# Patient Record
Sex: Female | Born: 1960 | Race: Black or African American | Hispanic: No | Marital: Married | Smoking: Never smoker
Health system: Southern US, Community
[De-identification: ages and names within clinical notes are randomized; demographics above are authoritative.]

## PROBLEM LIST (undated history)

## (undated) DIAGNOSIS — R7989 Other specified abnormal findings of blood chemistry: Secondary | ICD-10-CM

## (undated) DIAGNOSIS — J45909 Unspecified asthma, uncomplicated: Secondary | ICD-10-CM

## (undated) DIAGNOSIS — M4800 Spinal stenosis, site unspecified: Secondary | ICD-10-CM

## (undated) DIAGNOSIS — I10 Essential (primary) hypertension: Secondary | ICD-10-CM

## (undated) HISTORY — PX: TUBAL LIGATION: SHX77

## (undated) HISTORY — DX: Unspecified asthma, uncomplicated: J45.909

## (undated) HISTORY — PX: OOPHORECTOMY: SHX86

## (undated) HISTORY — DX: Other specified abnormal findings of blood chemistry: R79.89

## (undated) HISTORY — PX: DILATION AND CURETTAGE OF UTERUS: SHX78

## (undated) HISTORY — DX: Essential (primary) hypertension: I10

## (undated) HISTORY — DX: Spinal stenosis, site unspecified: M48.00

---

## 2002-10-03 ENCOUNTER — Other Ambulatory Visit: Admission: RE | Admit: 2002-10-03 | Discharge: 2002-10-03 | Payer: Self-pay | Admitting: Obstetrics and Gynecology

## 2008-12-17 ENCOUNTER — Encounter: Admission: RE | Admit: 2008-12-17 | Discharge: 2008-12-17 | Payer: Self-pay | Admitting: Family Medicine

## 2008-12-24 ENCOUNTER — Encounter: Admission: RE | Admit: 2008-12-24 | Discharge: 2008-12-24 | Payer: Self-pay | Admitting: Family Medicine

## 2008-12-31 ENCOUNTER — Encounter (INDEPENDENT_AMBULATORY_CARE_PROVIDER_SITE_OTHER): Payer: Self-pay | Admitting: *Deleted

## 2008-12-31 ENCOUNTER — Telehealth (INDEPENDENT_AMBULATORY_CARE_PROVIDER_SITE_OTHER): Payer: Self-pay | Admitting: *Deleted

## 2008-12-31 DIAGNOSIS — R933 Abnormal findings on diagnostic imaging of other parts of digestive tract: Secondary | ICD-10-CM | POA: Insufficient documentation

## 2009-01-01 ENCOUNTER — Telehealth (INDEPENDENT_AMBULATORY_CARE_PROVIDER_SITE_OTHER): Payer: Self-pay | Admitting: *Deleted

## 2009-01-05 ENCOUNTER — Ambulatory Visit (HOSPITAL_COMMUNITY): Admission: RE | Admit: 2009-01-05 | Discharge: 2009-01-05 | Payer: Self-pay | Admitting: Gastroenterology

## 2009-01-05 ENCOUNTER — Ambulatory Visit: Payer: Self-pay | Admitting: Gastroenterology

## 2009-05-06 ENCOUNTER — Emergency Department (HOSPITAL_COMMUNITY): Admission: EM | Admit: 2009-05-06 | Discharge: 2009-05-07 | Payer: Self-pay | Admitting: Emergency Medicine

## 2010-01-06 ENCOUNTER — Emergency Department (HOSPITAL_COMMUNITY): Admission: EM | Admit: 2010-01-06 | Discharge: 2010-01-06 | Payer: Self-pay | Admitting: Emergency Medicine

## 2010-01-30 ENCOUNTER — Encounter: Admission: RE | Admit: 2010-01-30 | Discharge: 2010-01-30 | Payer: Self-pay | Admitting: Family Medicine

## 2010-04-30 ENCOUNTER — Other Ambulatory Visit: Payer: Self-pay | Admitting: Family Medicine

## 2010-04-30 DIAGNOSIS — Z1231 Encounter for screening mammogram for malignant neoplasm of breast: Secondary | ICD-10-CM

## 2010-05-11 ENCOUNTER — Ambulatory Visit
Admission: RE | Admit: 2010-05-11 | Discharge: 2010-05-11 | Disposition: A | Discharge status: Home / Self Care | Payer: 59 | Source: Ambulatory Visit | Attending: Family Medicine | Admitting: Family Medicine

## 2010-05-11 DIAGNOSIS — Z1231 Encounter for screening mammogram for malignant neoplasm of breast: Secondary | ICD-10-CM

## 2010-05-26 LAB — POCT I-STAT, CHEM 8
Calcium, Ion: 1.17 mmol/L (ref 1.12–1.32)
Glucose, Bld: 111 mg/dL — ABNORMAL HIGH (ref 70–99)
HCT: 36 % (ref 36.0–46.0)
Hemoglobin: 12.2 g/dL (ref 12.0–15.0)
Potassium: 3.4 mEq/L — ABNORMAL LOW (ref 3.5–5.1)

## 2010-05-26 LAB — CBC
HCT: 34.2 % — ABNORMAL LOW (ref 36.0–46.0)
Hemoglobin: 11.4 g/dL — ABNORMAL LOW (ref 12.0–15.0)
MCV: 84.5 fL (ref 78.0–100.0)
Platelets: 332 10*3/uL (ref 150–400)
RBC: 4.05 MIL/uL (ref 3.87–5.11)
WBC: 4.4 10*3/uL (ref 4.0–10.5)

## 2010-05-26 LAB — COMPREHENSIVE METABOLIC PANEL
Albumin: 3.4 g/dL — ABNORMAL LOW (ref 3.5–5.2)
Alkaline Phosphatase: 50 U/L (ref 39–117)
BUN: 16 mg/dL (ref 6–23)
CO2: 29 mEq/L (ref 19–32)
Chloride: 106 mEq/L (ref 96–112)
GFR calc non Af Amer: >60 mL/min (ref >60)
Glucose, Bld: 116 mg/dL — ABNORMAL HIGH (ref 70–99)
Potassium: 3.3 mEq/L — ABNORMAL LOW (ref 3.5–5.1)
Total Bilirubin: 0.3 mg/dL (ref 0.3–1.2)

## 2010-05-26 LAB — DIFFERENTIAL
Basophils Absolute: 0 10*3/uL (ref 0.0–0.1)
Basophils Relative: 1 % (ref 0–1)
Eosinophils Relative: 4 % (ref 0–5)
Monocytes Absolute: 0.5 10*3/uL (ref 0.1–1.0)
Neutro Abs: 3 10*3/uL (ref 1.7–7.7)

## 2010-05-26 LAB — POCT CARDIAC MARKERS
CKMB, poc: 1.6 ng/mL (ref 1.0–8.0)
Troponin i, poc: <0.05 ng/mL (ref 0.00–0.09)

## 2010-06-01 ENCOUNTER — Other Ambulatory Visit: Payer: Self-pay | Admitting: Family Medicine

## 2010-06-01 DIAGNOSIS — E785 Hyperlipidemia, unspecified: Secondary | ICD-10-CM

## 2010-06-01 DIAGNOSIS — I10 Essential (primary) hypertension: Secondary | ICD-10-CM

## 2010-06-08 ENCOUNTER — Ambulatory Visit
Admission: RE | Admit: 2010-06-08 | Discharge: 2010-06-08 | Disposition: A | Discharge status: Home / Self Care | Payer: 59 | Source: Ambulatory Visit | Attending: Family Medicine | Admitting: Family Medicine

## 2010-06-08 DIAGNOSIS — I10 Essential (primary) hypertension: Secondary | ICD-10-CM

## 2010-06-08 DIAGNOSIS — E785 Hyperlipidemia, unspecified: Secondary | ICD-10-CM

## 2010-06-08 MED ORDER — GADOBENATE DIMEGLUMINE 529 MG/ML IV SOLN
19.0000 mL | Freq: Once | INTRAVENOUS | Status: AC | PRN
  Administered 2010-06-08: 19 mL via INTRAVENOUS

## 2012-12-13 ENCOUNTER — Ambulatory Visit: Payer: Self-pay | Admitting: Certified Nurse Midwife

## 2013-05-02 ENCOUNTER — Other Ambulatory Visit: Payer: Self-pay | Admitting: Family Medicine

## 2013-05-02 ENCOUNTER — Ambulatory Visit
Admission: RE | Admit: 2013-05-02 | Discharge: 2013-05-02 | Disposition: A | Discharge status: Home / Self Care | Payer: 59 | Source: Ambulatory Visit | Attending: Family Medicine | Admitting: Family Medicine

## 2013-05-02 DIAGNOSIS — M79609 Pain in unspecified limb: Secondary | ICD-10-CM

## 2015-06-11 ENCOUNTER — Other Ambulatory Visit: Payer: Self-pay | Admitting: Family Medicine

## 2015-06-11 DIAGNOSIS — R911 Solitary pulmonary nodule: Secondary | ICD-10-CM

## 2015-06-11 DIAGNOSIS — K863 Pseudocyst of pancreas: Secondary | ICD-10-CM

## 2015-06-19 ENCOUNTER — Ambulatory Visit
Admission: RE | Admit: 2015-06-19 | Discharge: 2015-06-19 | Disposition: A | Discharge status: Home / Self Care | Payer: Commercial Managed Care - HMO | Source: Ambulatory Visit | Attending: Family Medicine | Admitting: Family Medicine

## 2015-06-19 DIAGNOSIS — K863 Pseudocyst of pancreas: Secondary | ICD-10-CM

## 2015-06-19 DIAGNOSIS — R911 Solitary pulmonary nodule: Secondary | ICD-10-CM

## 2015-06-19 MED ORDER — IOPAMIDOL (ISOVUE-300) INJECTION 61%
100.0000 mL | Freq: Once | INTRAVENOUS | Status: AC | PRN
  Administered 2015-06-19: 100 mL via INTRAVENOUS

## 2015-12-23 ENCOUNTER — Other Ambulatory Visit: Payer: Self-pay | Admitting: Physical Medicine and Rehabilitation

## 2015-12-23 DIAGNOSIS — G8929 Other chronic pain: Secondary | ICD-10-CM

## 2015-12-23 DIAGNOSIS — M545 Low back pain, unspecified: Secondary | ICD-10-CM

## 2015-12-26 ENCOUNTER — Ambulatory Visit
Admission: RE | Admit: 2015-12-26 | Discharge: 2015-12-26 | Disposition: A | Discharge status: Home / Self Care | Payer: Commercial Managed Care - HMO | Source: Ambulatory Visit | Attending: Physical Medicine and Rehabilitation | Admitting: Physical Medicine and Rehabilitation

## 2015-12-26 DIAGNOSIS — G8929 Other chronic pain: Secondary | ICD-10-CM

## 2015-12-26 DIAGNOSIS — M545 Low back pain, unspecified: Secondary | ICD-10-CM

## 2016-01-18 ENCOUNTER — Other Ambulatory Visit: Payer: Self-pay | Admitting: Family Medicine

## 2016-01-18 ENCOUNTER — Other Ambulatory Visit: Payer: Self-pay | Admitting: Certified Nurse Midwife

## 2016-01-18 DIAGNOSIS — Z1231 Encounter for screening mammogram for malignant neoplasm of breast: Secondary | ICD-10-CM

## 2016-02-17 ENCOUNTER — Ambulatory Visit (INDEPENDENT_AMBULATORY_CARE_PROVIDER_SITE_OTHER): Payer: Commercial Managed Care - HMO | Admitting: Internal Medicine

## 2016-02-17 ENCOUNTER — Ambulatory Visit
Admission: RE | Admit: 2016-02-17 | Discharge: 2016-02-17 | Disposition: A | Discharge status: Home / Self Care | Payer: Commercial Managed Care - HMO | Source: Ambulatory Visit | Attending: Family Medicine | Admitting: Family Medicine

## 2016-02-17 ENCOUNTER — Encounter: Payer: Self-pay | Admitting: Internal Medicine

## 2016-02-17 VITALS — BP 184/86 | HR 113 | Ht 66.0 in | Wt 270.2 lb

## 2016-02-17 DIAGNOSIS — E7800 Pure hypercholesterolemia, unspecified: Secondary | ICD-10-CM | POA: Diagnosis not present

## 2016-02-17 DIAGNOSIS — I10 Essential (primary) hypertension: Secondary | ICD-10-CM

## 2016-02-17 DIAGNOSIS — Z1231 Encounter for screening mammogram for malignant neoplasm of breast: Secondary | ICD-10-CM

## 2016-02-17 MED ORDER — NEBIVOLOL HCL 10 MG PO TABS: 10.0000 mg | ORAL_TABLET | Freq: Every day | ORAL | 2 refills | Status: DC

## 2016-02-17 NOTE — Patient Instructions (Addendum)
Medication Instructions: Your physician has recommended you make the following change in your medication:  -- 1. START Bystolic 10 mg - take one tablet by mouth once daily  Labwork: - None Ordered  Procedures/Testing: - Your physician has requested that you have an echocardiogram. Echocardiography is a painless test that uses sound waves to create images of your heart. It provides your doctor with information about the size and shape of your heart and how well your heart's chambers and valves are working. This procedure takes approximately one hour. There are no restrictions for this procedure.  Follow-Up: - Your Primary Care- will call you to arrange a blood pressure check within 2 weeks  - Dr. Graciela HusbandsKlein has spoken with your Primary Care doctor about referring you to a hypertension clinic for your blood pressure   Any Additional Special Instructions Will Be Listed Below (If Applicable).     If you need a refill on your cardiac medications before your next appointment, please call your pharmacy.

## 2016-02-17 NOTE — Progress Notes (Signed)
-     ELECTROPHYSIOLOGY CONSULT NOTE  Patient ID: Pheonix Chang, MRN: [id number], DOB/AGE: 08-08-1960 55 y.o. Admit date: (Not on file) Date of Consult: 02/17/2016  Primary Physician: No primary care provider on file. Primary Cardiologist: new Consulting Physician Wilhemena Durie  Chief Complaint: uncontrolled hypertension    HPI Alexandra Chang is a 55 y.o. female   referred because of  Poorly controllepertension This is been going on for some years. She herself is not  Well aware of her history.   Laboratories 2011 including potassium of 3.3. She underwent an MRA 201 that was " Negative for renal artery stenosis".  Renal sizes were normal at that time  She does not  Recall UA evaluation by suspect it was done.   Her referral include "metanephrines normal "  She has significant dyspnea on exertion but deni chest discomfort. She has limiting back pain and leg pain. She has recently undergone injections for this. Laboratory abnormalities have included a CK which has been  In the 800/1000 range. No aldolase measurements were noted.  . She also has significant hypercholesteremia  with an LDL of greater than 250   she has been treated with a variety of medications.  She has not tolerated some beta blockers in the past. Most recently she has been on Aldactone,  Amlodipine and losartan. She comes in today on this regime. Her blood  Pressure today  190/110.   she is referred for a sleep evaluation; unfortunately finances do not allow its completion   History reviewed. No pertinent past medical history.    Surgical History: History reviewed. No pertinent surgical history.   Home Meds: Prior to Admission medications   Medication Sig Start Date End Date Taking? Authorizing Provider  amLODipine (NORVASC) 5 MG tablet Take 1 tablet by mouth daily. 02/15/16  Yes Historical Provider, MD  benzonatate (TESSALON) 100 MG capsule Take 1 capsule by mouth daily. 02/11/16  Yes Historical Provider, MD  linaclotide  (LINZESS) 145 MCG CAPS capsule Take 145 mcg by mouth daily before breakfast.   Yes Historical Provider, MD  losartan (COZAAR) 100 MG tablet Take 1 tablet by mouth daily. 02/15/16  Yes Historical Provider, MD  spironolactone (ALDACTONE) 100 MG tablet Take 1 tablet by mouth daily. 02/15/16  Yes Historical Provider, MD    Allergies:  Allergies  Allergen Reactions  . Statins Anaphylaxis  . Lisinopril Other (See Comments)    Pt unsure of reaction: thinks it may make her cough and keep awake through the night    Social History   Social History  . Marital status: Married    Spouse name: N/A  . Number of children: N/A  . Years of education: N/A   Occupational History  . Not on file.   Social History Main Topics  . Smoking status: Never Smoker  . Smokeless tobacco: Never Used  . Alcohol use Yes     Comment: rarely  . Drug use: No  . Sexual activity: Yes   Other Topics Concern  . Not on file   Social History Narrative  . No narrative on file     Family History  Problem Relation Age of Onset  . Hypertension Mother   . Cancer Maternal Grandmother      ROS:  Please see the history of present illness.     All other systems reviewed and negative.    Physical Exam: Blood pressure (!) 190/126, pulse (!) 113, height 5\' 6"  (1.676 m), weight 270 lb 3.2 oz (122.6  kg), SpO2 96 %. General: Well developed, well nourished female in no acute distress. Head: Normocephalic, atraumatic, sclera non-icteric, no xanthomas, nares are without discharge. EENT: normal  Lymph Nodes:  none Neck: Negative for carotid bruits. JVD not elevated. Back:without scoliosis kyphosis Lungs: Clear bilaterally to auscultation without wheezes, rales, or rhonchi. Breathing is unlabored. Heart: RRR with S1 S2.   2/6 systolic right upper sternal border murmur . No rubs, or gallops appreciated. Abdomen: Soft, non-tender, non-distended with normoactive bowel sounds. No hepatomegaly. No rebound/guarding. No obvious  abdominal masses. Msk:  Strength and tone appear normal for age. Extremities: No clubbing or cyanosis.  tr+ edema.  Distal pedal pulses are 2+ and equal bilaterally. Skin: Warm and Dry Neuro: Alert and oriented X 3. CN III-XII intact Grossly normal sensory and motor function . Psych:  Responds to questions appropriately with a normal affect.      Labs: Cardiac Enzymes No results for input(s): CKTOTAL, CKMB, TROPONINI in the last 72 hours. CBC Lab Results  Component Value Date   WBC 4.4 01/06/2010   HGB 12.2 01/06/2010   HCT 36.0 01/06/2010   MCV 84.5 01/06/2010   PLT 332 01/06/2010   PROTIME: No results for input(s): LABPROT, INR in the last 72 hours. Chemistry No results for input(s): NA, K, CL, CO2, BUN, CREATININE, CALCIUM, PROT, BILITOT, ALKPHOS, ALT, AST, GLUCOSE in the last 168 hours.  Invalid input(s): LABALBU Lipids No results found for: CHOL, HDL, LDLCALC, TRIG BNP No results found for: PROBNP Thyroid Function Tests: No results for input(s): TSH, T4TOTAL, T3FREE, THYROIDAB in the last 72 hours.  Invalid input(s): FREET3 Miscellaneous No results found for: DDIMER  Radiology/Studies:  Mm Screening Breast Tomo Bilateral  Result Date: 02/17/2016 CLINICAL DATA:  Screening. EXAM: 2D DIGITAL SCREENING BILATERAL MAMMOGRAM WITH CAD AND ADJUNCT TOMO COMPARISON:  Previous exam(s). ACR Breast Density Category b: There are scattered areas of fibroglandular density. FINDINGS: There are no findings suspicious for malignancy. Images were processed with CAD. IMPRESSION: No mammographic evidence of malignancy. A result letter of this screening mammogram will be mailed directly to the patient. RECOMMENDATION: Screening mammogram in one year. (Code:SM-B-01Y) BI-RADS CATEGORY  1: Negative. Electronically Signed   By: Frederico HammanMichelle  Collins M.D.   On: 02/17/2016 10:25    EKG: sinus tach 105 LVH with repol 14/07/35   Assessment and Plan:  Sinus Tachy  Severe Hypertension  Elevated  CPK  Hypercholestrolemia  WILL ADD BYSTOLIc ten mg today and if augmented therapy is needed would try hydralazine   Will check echo for endorgan effects and to exclude coarcatation   I spoke with radiiology and suggestion is that MRA 2011 may not be adequately sensitive and CTA would now be preferred This lady has significant hypertension despite multiple medications. It raises a concern as to a secondary cause of hypertension. Renovascular disease would be high on the list although is less common in African-Americans. With her leg pain, I wonder whether she is at risk for coarctation. I don't how to explain the elevated CPK.  Cushing's disease is also part of the list as his hyper-aldosteronism.  In this regard a potassium level of 3.3 noted 6 years ago may be of some important.  Sleep testing has been recommended.  She will need echo--  Will discuss with PCP regarding referral to BP speciality program  Will also refer to lipid clinic for consideration of injection therapy as hse has not been tolerant of statins in the past.    I have called and  spoken with her PCP and recommended HTN clinic referral   Sherryl MangesSteven Muntaha Vermette

## 2016-02-22 ENCOUNTER — Other Ambulatory Visit: Payer: Self-pay

## 2016-02-22 ENCOUNTER — Ambulatory Visit (HOSPITAL_COMMUNITY): Payer: Commercial Managed Care - HMO | Attending: Internal Medicine

## 2016-02-22 ENCOUNTER — Telehealth: Payer: Self-pay | Admitting: Internal Medicine

## 2016-02-22 DIAGNOSIS — I119 Hypertensive heart disease without heart failure: Secondary | ICD-10-CM | POA: Insufficient documentation

## 2016-02-22 DIAGNOSIS — Z6841 Body Mass Index (BMI) 40.0 and over, adult: Secondary | ICD-10-CM | POA: Diagnosis not present

## 2016-02-22 DIAGNOSIS — I10 Essential (primary) hypertension: Secondary | ICD-10-CM

## 2016-02-22 DIAGNOSIS — E785 Hyperlipidemia, unspecified: Secondary | ICD-10-CM | POA: Insufficient documentation

## 2016-02-22 NOTE — Telephone Encounter (Signed)
Walk In pt Form-Medication List Dropped Off-gave to Shanda BumpsJessica to take around/KM

## 2016-02-24 ENCOUNTER — Ambulatory Visit (INDEPENDENT_AMBULATORY_CARE_PROVIDER_SITE_OTHER): Payer: Commercial Managed Care - HMO | Admitting: Pharmacist

## 2016-02-24 VITALS — BP 172/105 | HR 80

## 2016-02-24 DIAGNOSIS — I10 Essential (primary) hypertension: Secondary | ICD-10-CM | POA: Insufficient documentation

## 2016-02-24 DIAGNOSIS — E785 Hyperlipidemia, unspecified: Secondary | ICD-10-CM | POA: Diagnosis not present

## 2016-02-24 MED ORDER — NEBIVOLOL HCL 20 MG PO TABS: 20.0000 mg | ORAL_TABLET | Freq: Every day | ORAL | 3 refills | Status: DC

## 2016-02-24 MED ORDER — AMLODIPINE BESYLATE 10 MG PO TABS
10.0000 mg | ORAL_TABLET | Freq: Every day | ORAL | 3 refills | Status: AC
Start: 2016-02-24 — End: ?

## 2016-02-24 MED ORDER — PITAVASTATIN CALCIUM 1 MG PO TABS: 1.0000 mg | ORAL_TABLET | Freq: Every day | ORAL | 30 refills | Status: DC

## 2016-02-24 NOTE — Progress Notes (Signed)
Patient ID: Alexandra Chang                 DOB: 05/26/1960                      MRN: [id number]     HPI: Alexandra Chang is a 55 y.o. female referred by Dr. Graciela HusbandsKlein to pharmacy clinic for HTN and HLD management. PMH is significant for poorly controlled HTN and HLD. She underwent an MRA that was negative for renal artery stenosis. At visit with Dr Graciela HusbandsKlein last week, Bystolic was added for BP elevation of 180s/80s in clinic. She has also had a history of statin intolerance with CK elevations up to 2,000. Most recent CK around 1,000 while patient was not on statin therapy. She followed up with rheumatology who could not find an alternative source for the CK elevation.  Pt reports that she has tolerated the Bystolic well. She does have a bit of a dry cough. She previously experienced this with lisinopril and was switched to losartan. States that it is not bothersome. Has checked her BP at home a few times since starting Bystolic. Home readings have been improved at 166/88, HR down to 70-80s. She does not drink caffeine.  Pt is intolerant to rosuvastatin, atorvastatin, and simvastatin. She experienced myalgias and had CK elevations to 2311. However, she has not tried any statin within the last 6 years and her CK was still elevated above 1,000 last month. Her rheumatologist could not find another source for the CK elevation. She has some muscle spasms at baseline in her thighs.   Current HTN meds: Bystolic 10mg  daily, spironolactone 100mg  daily, losartan 100mg  daily, amlodipine 5mg  daily  Previously tried: lisinopril (cough) BP goal: <130/7580mmHg Home BP readings: 160/80s  Current lipid meds: none Intolerances: rosuvastatin 10mg  daily (2010), simvastatin 40mg  (2011), atorvastatin 20mg  daily (2011) - myalgias and CK elevations up to 2311 however CK still elevated off of statins at 1067 in 01/2016 Goal: LDL < 130mg /dL for primary prevention Labs: 01/13/16: TC 337, TG 120, HDL 66, LDL 247 (no therapy)  Family  History: Mother with HTN.  Social History: Denies tobacco use and illicit drug use. Rarely drinks alcohol.  Diet: Does not drink caffeine. Does eat salt and goes out to fast food about 5 times a week.  Exercise: Limited due to back pain. Sees an orthopedist and has had 2 back injections which has helped a little.  Wt Readings from Last 3 Encounters:  02/17/16 270 lb 3.2 oz (122.6 kg)   BP Readings from Last 3 Encounters:  02/17/16 (!) 184/86   Pulse Readings from Last 3 Encounters:  02/17/16 (!) 113    Renal function: CrCl cannot be calculated (Patient's most recent lab result is older than the maximum 21 days allowed.).  No past medical history on file.  Current Outpatient Prescriptions on File Prior to Visit  Medication Sig Dispense Refill  . amLODipine (NORVASC) 5 MG tablet Take 1 tablet by mouth daily.    . benzonatate (TESSALON) 100 MG capsule Take 1 capsule by mouth daily.    Marland Kitchen. linaclotide (LINZESS) 145 MCG CAPS capsule Take 145 mcg by mouth daily before breakfast.    . losartan (COZAAR) 100 MG tablet Take 1 tablet by mouth daily.    . nebivolol (BYSTOLIC) 10 MG tablet Take 1 tablet (10 mg total) by mouth daily. 30 tablet 2  . spironolactone (ALDACTONE) 100 MG tablet Take 1 tablet by mouth daily.  No current facility-administered medications on file prior to visit.     Allergies  Allergen Reactions  . Statins Anaphylaxis  . Lisinopril Other (See Comments)    Pt unsure of reaction: thinks it may make her cough and keep awake through the night     Assessment/Plan:  1. Hypertension - BP still elevated at 172/105. Will increase amlodipine to 10mg  and increase Bystolic to 20mg . She will try to cut back on fast food. Pt has follow up for HTN with a specialty clinic at her PCP in 1 week. They will start managing her HTN.  2. Hyperlipidemia - LDL extremely elevated at [id number] and is intolerant to 3 statins including CK elevations. Unfortunately she will not qualify for  PCSK9i because she does not have FH (Dutch score of only 3) or ASCVD. Will start Livalo 1mg  daily and recheck CK in 2 weeks. If pt is tolerating well, will plan to start Zetia. Combination should lower LDL ~40-50% if pt tolerates therapies. Pt will call in a few weeks to let me know how she tolerates Livalo.   Chyla Schlender E. Michial Disney, PharmD, CPP, BCACP  [facility] [address], [place], [place] [address] Phone: [phone]; Fax: [phone] 02/24/2016 10:56 AM

## 2016-02-24 NOTE — Patient Instructions (Addendum)
Blood pressure:  Increase amlodipine to 10mg  daily.  Increase Bystolic to 20mg  daily.  Try to cut back on fast food and salt intake.  Continue other blood pressure medications and follow up with primary care doctor as scheduled. Continue to check your blood pressure readings at home.   Cholesterol:  Start taking Livalo 1mg  daily.  Recheck creatinine kinase (CK) in 2 weeks on Wednesday December 27th any time after 7:30am.  Call Aundra MilletMegan if you have problems tolerating it #[phone].  Talk to your primary care doctor about trying a muscle relaxant to see if that helps with your muscle spasms.

## 2016-03-09 ENCOUNTER — Other Ambulatory Visit: Payer: Commercial Managed Care - HMO

## 2016-03-16 ENCOUNTER — Telehealth: Payer: Self-pay | Admitting: Pharmacist

## 2016-03-16 ENCOUNTER — Ambulatory Visit (INDEPENDENT_AMBULATORY_CARE_PROVIDER_SITE_OTHER): Payer: Commercial Managed Care - HMO | Admitting: Certified Nurse Midwife

## 2016-03-16 ENCOUNTER — Encounter: Payer: Self-pay | Admitting: Certified Nurse Midwife

## 2016-03-16 VITALS — BP 120/78 | HR 70 | Resp 16 | Ht 66.25 in | Wt 264.0 lb

## 2016-03-16 DIAGNOSIS — Z01419 Encounter for gynecological examination (general) (routine) without abnormal findings: Secondary | ICD-10-CM

## 2016-03-16 DIAGNOSIS — N951 Menopausal and female climacteric states: Secondary | ICD-10-CM

## 2016-03-16 DIAGNOSIS — Z124 Encounter for screening for malignant neoplasm of cervix: Secondary | ICD-10-CM | POA: Diagnosis not present

## 2016-03-16 NOTE — Telephone Encounter (Signed)
Pt called and stated that she never picked up her Livalo because the copay was $55. Asked if she brought her copay card that I gave her with her to the pharmacy and she said no. Also states she has had back pain and is nervous to start any statins because of her pain. Pt is agreeable to start Livalo once her back pain improves. She will call clinic when she starts taking it so that we can reschedule her CK check for 2 weeks after.

## 2016-03-16 NOTE — Patient Instructions (Signed)

## 2016-03-16 NOTE — Progress Notes (Signed)
56 y.o. G3 L2. Married  African American Fe here to re-establish gyn care, for annual exam. Menopausal no HRT, still having occasional hot flashes. Denies any vaginal bleeding or vaginal dryness. Sees Cardiology([facility]) for hypertension control due to elevation level, also does labs too. Saw Kidney  specialist due to concern with hypertension insult on kidneys.. Seeing Orthopedic for pain in knee and thigh from fall should took last year.Marland Kitchen Has been busy with follow ups. Has eye appointment scheduled and she will be through with all her follow care. Still caring for family, now taking care of aunt, but has other family support. No other health issues today.  No LMP recorded. Patient is postmenopausal.          Sexually active: Yes.    The current method of family planning is tubal ligation.    Exercising: No.  exercise Smoker:  no  Health Maintenance: Pap:  2012 no abnormals per patient MMG:  02-17-16 category b density birads 1:neg Colonoscopy:  2017 polyps f/u 6yrs polyps BMD:   none TDaP:  Pt believes its been over 62yrs Shingles: no Pneumonia: no Hep C and HIV: not done Labs: pcp Self breast exam: monthly   reports that she has never smoked. She has never used smokeless tobacco. She reports that she does not drink alcohol or use drugs.  Past Medical History:  Diagnosis Date  . Asthma    as a child  . Hypertension   . Spinal stenosis     Past Surgical History:  Procedure Laterality Date  . CESAREAN SECTION    . DILATION AND CURETTAGE OF UTERUS    . OOPHORECTOMY     both  . TUBAL LIGATION      Current Outpatient Prescriptions  Medication Sig Dispense Refill  . amLODipine (NORVASC) 10 MG tablet Take 1 tablet (10 mg total) by mouth daily. 90 tablet 3  . benzonatate (TESSALON) 100 MG capsule Take 1 capsule by mouth as needed.     . furosemide (LASIX) 80 MG tablet     . linaclotide (LINZESS) 145 MCG CAPS capsule Take 145 mcg by mouth daily before breakfast.    . losartan  (COZAAR) 100 MG tablet Take 1 tablet by mouth daily.    . Nebivolol HCl 20 MG TABS Take 1 tablet (20 mg total) by mouth daily. (Patient taking differently: Take 10 mg by mouth daily. ) 90 tablet 3  . spironolactone (ALDACTONE) 100 MG tablet Take 1 tablet by mouth daily.     No current facility-administered medications for this visit.     Family History  Problem Relation Age of Onset  . Hypertension Mother   . Diabetes Mother   . Ovarian cancer Maternal Grandmother   . Hypertension Maternal Grandmother   . Diabetes Maternal Grandmother   . Colon cancer Brother     ROS:  Pertinent items are noted in HPI.  Otherwise, a comprehensive ROS was negative.  Exam:   BP 120/78   Pulse 70   Resp 16   Ht 5' 6.25" (1.683 m)   Wt 264 lb (119.7 kg)   BMI 42.29 kg/m  Height: 5' 6.25" (168.3 cm) Ht Readings from Last 3 Encounters:  03/16/16 5' 6.25" (1.683 m)  02/17/16 5\' 6"  (1.676 m)    General appearance: alert, cooperative and appears stated age Head: Normocephalic, without obvious abnormality, atraumatic Neck: no adenopathy, supple, symmetrical, trachea midline and thyroid normal to inspection and palpation Lungs: clear to auscultation bilaterally Breasts: No nipple retraction or  dimpling, No nipple discharge or bleeding, No axillary or supraclavicular adenopathy Heart: regular rate and rhythm Abdomen: soft, non-tender; no masses,  no organomegaly Extremities: extremities normal, atraumatic, no cyanosis or edema Skin: Skin color, texture, turgor normal. No rashes or lesions Lymph nodes: Cervical, supraclavicular, and axillary nodes normal. No abnormal inguinal nodes palpated Neurologic: Grossly normal   Pelvic: External genitalia:  no lesions              Urethra:  normal appearing urethra with no masses, tenderness or lesions              Bartholin's and Skene's: normal                 Vagina: normal appearing vagina with normal color and discharge, no lesions               Cervix: multiparous appearance, no cervical motion tenderness and no lesions              Pap taken: Yes.   Bimanual Exam:  Uterus:  normal size, contour, position, consistency, mobility, non-tender, limited palpation due to abdominal scars from cholescetomy              Adnexa: normal adnexa and no mass, fullness, tenderness               Rectovaginal: Confirms               Anus:  normal sphincter tone, no lesions  Chaperone present: yes  A:  Well Woman with normal exam  Menopausal  No HRT  Hypertension/knee injury/cholesterol management with MD  P:   Reviewed health and wellness pertinent to exam  Aware of need to evaluate if vaginal bleeding  Continue follow up with MD as indicated  Pap smear as above with HPVHR   counseled on breast self exam, mammography screening, menopause, adequate intake of calcium and vitamin D, diet and exercise  Note: Provider had made sure patient down from exam table prior to leaving room. After stepping outside door, heard patient call out and entered room to find patientit propped up against wall. Patient said she was dizzy and to prevent falling missed holding on to sink when knee gave way. Acknowledged she had not eaten after taking her medication. Denies any injuries. Patient coherent, color good. BP 140/90 P 76 R 18. No physical injuries seen. Patient given some soda and felt better. BP 140/88 P 80, R 18 Color good.CMA remained with patient until dressed and  Ambulated without problems and [facility] after sitting to check out with out any problems. "Feel fine and do not need to be seen in ER" No injuries. Reminded patient she should not taken hypertension medication with out food and that BP can drop after taking medication, which can increase her risk of dizziness. Encouraged patient to let MD know of dizziness episode with medication use. Encouraged patient to call if change in status. Patient agreeable.Marland Kitchen.  Rv prn     An After Visit  Summary was printed and given to the patient.

## 2016-03-18 LAB — IPS PAP TEST WITH HPV

## 2016-03-18 NOTE — Progress Notes (Signed)
Encounter reviewed Jill Jertson, MD   

## 2016-03-23 ENCOUNTER — Telehealth: Payer: Self-pay

## 2016-03-23 NOTE — Telephone Encounter (Signed)
Prior auth for Livalo 1mg  [facility] to [facility].

## 2016-03-29 NOTE — Telephone Encounter (Signed)
Noted. Thanks.

## 2016-03-29 NOTE — Telephone Encounter (Signed)
Prior auth denies for Livalo - not on formulary so there is no coverage criteria. Pt already has copay card, this should make copays affordable despite lack of insurance coverage. Pt already advised to call clinic once she starts Livalo so that we can schedule follow up CK.

## 2016-04-05 DIAGNOSIS — M4316 Spondylolisthesis, lumbar region: Secondary | ICD-10-CM | POA: Diagnosis not present

## 2016-04-28 DIAGNOSIS — E782 Mixed hyperlipidemia: Secondary | ICD-10-CM | POA: Diagnosis not present

## 2016-04-28 DIAGNOSIS — M609 Myositis, unspecified: Secondary | ICD-10-CM | POA: Diagnosis not present

## 2016-04-28 DIAGNOSIS — I10 Essential (primary) hypertension: Secondary | ICD-10-CM | POA: Diagnosis not present

## 2016-05-17 DIAGNOSIS — I10 Essential (primary) hypertension: Secondary | ICD-10-CM | POA: Diagnosis not present

## 2016-06-01 DIAGNOSIS — M545 Low back pain: Secondary | ICD-10-CM | POA: Diagnosis not present

## 2016-06-01 DIAGNOSIS — R748 Abnormal levels of other serum enzymes: Secondary | ICD-10-CM | POA: Diagnosis not present

## 2016-06-14 DIAGNOSIS — I10 Essential (primary) hypertension: Secondary | ICD-10-CM | POA: Diagnosis not present

## 2016-06-15 ENCOUNTER — Telehealth: Payer: Self-pay | Admitting: Pharmacist

## 2016-06-15 NOTE — Telephone Encounter (Signed)
Have tried contacting pt multiple times over the past few months to see if she had started taking Livalo yet. Have not been able to get in touch with pt and she has not returned any calls yet.

## 2016-07-26 DIAGNOSIS — I10 Essential (primary) hypertension: Secondary | ICD-10-CM | POA: Diagnosis not present

## 2016-07-26 DIAGNOSIS — E782 Mixed hyperlipidemia: Secondary | ICD-10-CM | POA: Diagnosis not present

## 2016-07-26 DIAGNOSIS — M609 Myositis, unspecified: Secondary | ICD-10-CM | POA: Diagnosis not present

## 2017-02-06 DIAGNOSIS — N183 Chronic kidney disease, stage 3 (moderate): Secondary | ICD-10-CM | POA: Diagnosis not present

## 2017-02-06 DIAGNOSIS — I129 Hypertensive chronic kidney disease with stage 1 through stage 4 chronic kidney disease, or unspecified chronic kidney disease: Secondary | ICD-10-CM | POA: Diagnosis not present

## 2017-02-06 DIAGNOSIS — E782 Mixed hyperlipidemia: Secondary | ICD-10-CM | POA: Diagnosis not present

## 2017-02-23 DIAGNOSIS — I129 Hypertensive chronic kidney disease with stage 1 through stage 4 chronic kidney disease, or unspecified chronic kidney disease: Secondary | ICD-10-CM | POA: Diagnosis not present

## 2017-03-06 ENCOUNTER — Other Ambulatory Visit: Payer: Self-pay | Admitting: Internal Medicine

## 2017-03-06 MED ORDER — NEBIVOLOL HCL 20 MG PO TABS: 20.0000 mg | ORAL_TABLET | Freq: Every day | ORAL | 0 refills | Status: DC

## 2017-03-10 ENCOUNTER — Ambulatory Visit (HOSPITAL_COMMUNITY)
Admission: EM | Admit: 2017-03-10 | Discharge: 2017-03-10 | Disposition: A | Discharge status: Home / Self Care | Payer: 59 | Attending: Internal Medicine | Admitting: Internal Medicine

## 2017-03-10 ENCOUNTER — Encounter (HOSPITAL_COMMUNITY): Payer: Self-pay | Admitting: Family Medicine

## 2017-03-10 DIAGNOSIS — J40 Bronchitis, not specified as acute or chronic: Secondary | ICD-10-CM | POA: Diagnosis not present

## 2017-03-10 MED ORDER — ALBUTEROL SULFATE HFA 108 (90 BASE) MCG/ACT IN AERS: 1.0000 | INHALATION_SPRAY | Freq: Four times a day (QID) | RESPIRATORY_TRACT | 0 refills | Status: DC | PRN

## 2017-03-10 MED ORDER — PREDNISONE 10 MG (21) PO TBPK: ORAL_TABLET | Freq: Every day | ORAL | 0 refills | Status: DC

## 2017-03-10 NOTE — ED Provider Notes (Signed)
[facility]    CSN: [id number] Arrival date & time: 03/10/17  1024     History   Chief Complaint Chief Complaint  [id number] presents with  . Cough  . Wheezing    HPI Alexandra Chang is a 56 y.o. female.   Alexandra Chang presents with complaints of cough and wheezing. Cough causes her chest and throat to be sore. She feels short of breath with coughing. Without difficulty breathing. Without fevers. States she has a history of asthma but has not a flair in many years. Has taken a cough medication which has helped some. No known ill contacts. Rates pain 3/10. Does take daily lasix, without increase in swelling. Does not smoke. Does not have an inhaler. Without congestion or runny nose.   ROS per HPI.       Past Medical History:  Diagnosis Date  . Asthma    as a child  . Hypertension   . Spinal stenosis     Patient Active Problem List   Diagnosis Date Noted  . Hyperlipidemia 02/24/2016  . Essential hypertension 02/24/2016  . NONSPECIFIC ABN FINDING RAD & OTH EXAM GI TRACT 12/31/2008    Past Surgical History:  Procedure Laterality Date  . CESAREAN SECTION    . DILATION AND CURETTAGE OF UTERUS    . OOPHORECTOMY     both  . TUBAL LIGATION      OB History    Gravida Para Term Preterm AB Living   3       1 2    SAB TAB Ectopic Multiple Live Births   1               Home Medications    Prior to Admission medications   Medication Sig Start Date End Date Taking? Authorizing Provider  albuterol (PROVENTIL HFA;VENTOLIN HFA) 108 (90 Base) MCG/ACT inhaler Inhale 1-2 puffs into the lungs every 6 (six) hours as needed for wheezing or shortness of breath. 03/10/17   Georgetta HaberBurky, Natalie B, NP  amLODipine (NORVASC) 10 MG tablet Take 1 tablet (10 mg total) by mouth daily. 02/24/16   Duke SalviaKlein, Steven C, MD  furosemide (LASIX) 80 MG tablet  03/11/16   [provider]  losartan (COZAAR) 100 MG tablet Take 1 tablet by mouth daily. 02/15/16   [provider]    Nebivolol HCl 20 MG TABS Take 1 tablet (20 mg total) by mouth daily. Please make overdue yearly appt with Dr. Graciela HusbandsKlein before anymore refills. 1st attempt 03/06/17   Duke SalviaKlein, Steven C, MD  predniSONE (STERAPRED UNI-PAK 21 TAB) 10 MG (21) TBPK tablet Take by mouth daily. Take 6 tabs by mouth daily  for 2 days, then 5 tabs for 2 days, then 4 tabs for 2 days, then 3 tabs for 2 days, 2 tabs for 2 days, then 1 tab by mouth daily for 2 days 03/10/17   Linus MakoBurky, Natalie B, NP  spironolactone (ALDACTONE) 100 MG tablet Take 1 tablet by mouth daily. 02/15/16   [provider]    Family History Family History  Problem Relation Age of Onset  . Hypertension Mother   . Diabetes Mother   . Ovarian cancer Maternal Grandmother   . Hypertension Maternal Grandmother   . Diabetes Maternal Grandmother   . Colon cancer Brother     Social History Social History   Tobacco Use  . Smoking status: Never Smoker  . Smokeless tobacco: Never Used  Substance Use Topics  . Alcohol use: No  . Drug use: No  Allergies   Statins and Lisinopril   Review of Systems Review of Systems   Physical Exam Triage Vital Signs ED Triage Vitals [03/10/17 1113]  Enc Vitals Group     BP (!) 150/60     Pulse Rate 80     Resp 20     Temp 98.3 F (36.8 C)     Temp Source Oral     SpO2 98 %     Weight      Height      Head Circumference      Peak Flow      Pain Score      Pain Loc      Pain Edu?      Excl. in GC?    No data found.  Updated Vital Signs BP (!) 150/60   Pulse 80   Temp 98.3 F (36.8 C) (Oral)   Resp 20   SpO2 98%   Visual Acuity Right Eye Distance:   Left Eye Distance:   Bilateral Distance:    Right Eye Near:   Left Eye Near:    Bilateral Near:     Physical Exam  Constitutional: She is oriented to person, place, and time. She appears well-developed and well-nourished. No distress.  HENT:  Head: Normocephalic and atraumatic.  Right Ear: Tympanic membrane, external ear and ear  canal normal.  Left Ear: Tympanic membrane, external ear and ear canal normal.  Nose: Nose normal.  Mouth/Throat: Uvula is midline, oropharynx is clear and moist and mucous membranes are normal. No tonsillar exudate.  Eyes: Conjunctivae and EOM are normal. Pupils are equal, round, and reactive to light.  Cardiovascular: Normal rate, regular rhythm and normal heart sounds.  Pulmonary/Chest: Effort normal and breath sounds normal.  Strong dry cough noted  Neurological: She is alert and oriented to person, place, and time.  Skin: Skin is warm and dry.     UC Treatments / Results  Labs (all labs ordered are listed, but only abnormal results are displayed) Labs Reviewed - No data to display  EKG  EKG Interpretation None       Radiology No results found.  Procedures Procedures (including critical care time)  Medications Ordered in UC Medications - No data to display   Initial Impression / Assessment and Plan / UC Course  I have reviewed the triage vital signs and the nursing notes.  Pertinent labs & imaging results that were available during my care of the patient were reviewed by me and considered in my medical decision making (see chart for details).     Vitals stable. Non toxic in appearance. Without respiratory distress. Inhaler prn for wheezing. Complete course of prednisone. Return precautions provided. Patient verbalized understanding and agreeable to plan.  If symptoms worsen or do not improve in the next week to return to be seen or to follow up with PCP.    Final Clinical Impressions(s) / UC Diagnoses   Final diagnoses:  Bronchitis    ED Discharge Orders        Ordered    predniSONE (STERAPRED UNI-PAK 21 TAB) 10 MG (21) TBPK tablet  Daily     03/10/17 1139    albuterol (PROVENTIL HFA;VENTOLIN HFA) 108 (90 Base) MCG/ACT inhaler  Every 6 hours PRN     03/10/17 1139       Controlled Substance Prescriptions [place] Controlled Substance Registry consulted? Not  Applicable   Georgetta HaberBurky, Natalie B, NP 03/10/17 1144

## 2017-03-10 NOTE — ED Triage Notes (Signed)
Pt here for 1 week of cough, wheezing and SOB.

## 2017-03-22 ENCOUNTER — Ambulatory Visit: Payer: Commercial Managed Care - HMO | Admitting: Certified Nurse Midwife

## 2017-04-05 ENCOUNTER — Ambulatory Visit (HOSPITAL_COMMUNITY)
Admission: EM | Admit: 2017-04-05 | Discharge: 2017-04-05 | Disposition: A | Discharge status: Home / Self Care | Payer: 59 | Attending: Family Medicine | Admitting: Family Medicine

## 2017-04-05 ENCOUNTER — Ambulatory Visit (INDEPENDENT_AMBULATORY_CARE_PROVIDER_SITE_OTHER): Payer: 59

## 2017-04-05 ENCOUNTER — Encounter (HOSPITAL_COMMUNITY): Payer: Self-pay | Admitting: Emergency Medicine

## 2017-04-05 DIAGNOSIS — R0602 Shortness of breath: Secondary | ICD-10-CM

## 2017-04-05 DIAGNOSIS — R05 Cough: Secondary | ICD-10-CM

## 2017-04-05 DIAGNOSIS — J029 Acute pharyngitis, unspecified: Secondary | ICD-10-CM | POA: Diagnosis not present

## 2017-04-05 DIAGNOSIS — R059 Cough, unspecified: Secondary | ICD-10-CM

## 2017-04-05 MED ORDER — IPRATROPIUM-ALBUTEROL 0.5-2.5 (3) MG/3ML IN SOLN
3.0000 mL | Freq: Once | RESPIRATORY_TRACT | Status: AC
Start: 2017-04-05 — End: 2017-04-05
  Administered 2017-04-05: 3 mL via RESPIRATORY_TRACT

## 2017-04-05 MED ORDER — IPRATROPIUM-ALBUTEROL 0.5-2.5 (3) MG/3ML IN SOLN
RESPIRATORY_TRACT | Status: AC
  Filled 2017-04-05: qty 3

## 2017-04-05 MED ORDER — BENZONATATE 100 MG PO CAPS: 100.0000 mg | ORAL_CAPSULE | Freq: Three times a day (TID) | ORAL | 0 refills | Status: AC

## 2017-04-05 NOTE — Discharge Instructions (Signed)
Chest xray was normal- no signs of pneumonia. We gave you a breathing treatment today. Please use inhaler as needed for shortness of breath. Coughs can linger for 4-6 weeks.   Please try tessalon for cough- this should not affect blood pressure.   Please follow up with Dr. Tiburcio PeaHarris if symptoms persisting.

## 2017-04-05 NOTE — ED Provider Notes (Signed)
[facility]    CSN: [id number] Arrival date & time: 04/05/17  1725     History   Chief Complaint Chief Complaint  Patient presents with  . Cough    HPI Alexandra Chang is a 57 y.o. female history of HTN, hyperlipidemia presenting with cough and chest congestion. Was seen here a little under a month ago for similar symptoms, treated with prednisone and albuterol inhaler. She states she improved and symptoms got better but coughing returned and has persisted for a couple of weeks. States she does not struggle with cough at night. History of asthma when she was younger but has not had issues as an adult. Not using inhaler much as it causes sore throat. Denies fever, nausea, vomiting, Mild congestion and drainage; states she has allergies and believes something in her house triggers this. She has been using nyquil, robitussin and cough drops, using only a little because she is concerned about blood pressure. No leg swelling, not on OCP's, no personal history of cancer, no recent travel/immobilization, no smoking.   HPI  Past Medical History:  Diagnosis Date  . Asthma    as a child  . Hypertension   . Spinal stenosis     Patient Active Problem List   Diagnosis Date Noted  . Hyperlipidemia 02/24/2016  . Essential hypertension 02/24/2016  . NONSPECIFIC ABN FINDING RAD & OTH EXAM GI TRACT 12/31/2008    Past Surgical History:  Procedure Laterality Date  . CESAREAN SECTION    . DILATION AND CURETTAGE OF UTERUS    . OOPHORECTOMY     both  . TUBAL LIGATION      OB History    Gravida Para Term Preterm AB Living   3       1 2    SAB TAB Ectopic Multiple Live Births   1               Home Medications    Prior to Admission medications   Medication Sig Start Date End Date Taking? Authorizing Provider  albuterol (PROVENTIL HFA;VENTOLIN HFA) 108 (90 Base) MCG/ACT inhaler Inhale 1-2 puffs into the lungs every 6 (six) hours as needed for wheezing or shortness of breath.  03/10/17  Yes Burky, Dorene GrebeNatalie B, NP  amLODipine (NORVASC) 10 MG tablet Take 1 tablet (10 mg total) by mouth daily. 02/24/16  Yes Duke SalviaKlein, Steven C, MD  furosemide (LASIX) 80 MG tablet  03/11/16  Yes [provider]  losartan (COZAAR) 100 MG tablet Take 1 tablet by mouth daily. 02/15/16  Yes [provider]  Nebivolol HCl 20 MG TABS Take 1 tablet (20 mg total) by mouth daily. Please make overdue yearly appt with Dr. Graciela HusbandsKlein before anymore refills. 1st attempt 03/06/17  Yes Duke SalviaKlein, Steven C, MD  predniSONE (STERAPRED UNI-PAK 21 TAB) 10 MG (21) TBPK tablet Take by mouth daily. Take 6 tabs by mouth daily  for 2 days, then 5 tabs for 2 days, then 4 tabs for 2 days, then 3 tabs for 2 days, 2 tabs for 2 days, then 1 tab by mouth daily for 2 days 03/10/17  Yes Burky, Dorene GrebeNatalie B, NP  spironolactone (ALDACTONE) 100 MG tablet Take 1 tablet by mouth daily. 02/15/16  Yes [provider]  benzonatate (TESSALON) 100 MG capsule Take 1 capsule (100 mg total) by mouth every 8 (eight) hours for 7 days. 04/05/17 04/12/17  Valene Villa, Junius CreamerHallie C, PA-C    Family History Family History  Problem Relation Age of Onset  . Hypertension  Mother   . Diabetes Mother   . Ovarian cancer Maternal Grandmother   . Hypertension Maternal Grandmother   . Diabetes Maternal Grandmother   . Colon cancer Brother     Social History Social History   Tobacco Use  . Smoking status: Never Smoker  . Smokeless tobacco: Never Used  Substance Use Topics  . Alcohol use: No  . Drug use: No     Allergies   Statins and Lisinopril   Review of Systems Review of Systems  Constitutional: Negative for chills, fatigue and fever.  HENT: Positive for congestion, ear pain, rhinorrhea and sore throat. Negative for sinus pressure and trouble swallowing.   Respiratory: Positive for cough and shortness of breath. Negative for chest tightness.   Cardiovascular: Negative for chest pain.  Gastrointestinal: Negative for abdominal  pain, nausea and vomiting.  Musculoskeletal: Negative for myalgias.  Skin: Negative for rash.  Neurological: Negative for dizziness, light-headedness and headaches.     Physical Exam Triage Vital Signs ED Triage Vitals  Enc Vitals Group     BP      Pulse      Resp      Temp      Temp src      SpO2      Weight      Height      Head Circumference      Peak Flow      Pain Score      Pain Loc      Pain Edu?      Excl. in GC?    No data found.  Updated Vital Signs BP (!) 146/78 (BP Location: Right Arm)   Pulse 80   Temp 98.5 F (36.9 C) (Oral)   Resp 20   SpO2 100%    Physical Exam  Constitutional: She appears well-developed and well-nourished. No distress.  HENT:  Head: Normocephalic and atraumatic.  Right Ear: Tympanic membrane and ear canal normal.  Left Ear: Tympanic membrane and ear canal normal.  Nose: Rhinorrhea present.  Mouth/Throat: Uvula is midline and mucous membranes are normal. No oral lesions. No trismus in the jaw. No uvula swelling. Posterior oropharyngeal erythema present. Tonsils are 1+ on the right. Tonsils are 1+ on the left. No tonsillar exudate.  nonerythematous TM's  Eyes: Conjunctivae are normal.  Neck: Neck supple.  Cardiovascular: Normal rate and regular rhythm.  No murmur heard. Pulmonary/Chest: Effort normal and breath sounds normal. No respiratory distress.  Abdominal: Soft. There is no tenderness.  Musculoskeletal: She exhibits no edema.  Neurological: She is alert.  Skin: Skin is warm and dry.  Psychiatric: She has a normal mood and affect.  Nursing note and vitals reviewed.    UC Treatments / Results  Labs (all labs ordered are listed, but only abnormal results are displayed) Labs Reviewed - No data to display  EKG  EKG Interpretation None       Radiology Dg Chest 2 View  Result Date: 04/05/2017 CLINICAL DATA:  57 year old female with a history of cough EXAM: CHEST  2 VIEW COMPARISON:  05/31/2010, 01/06/2010, CT  chest 06/19/2015 FINDINGS: Cardiomediastinal silhouette within normal limits. No evidence of central vascular congestion. No pneumothorax or pleural effusion. No confluent airspace disease. Degenerative changes of the midthoracic spine. Eggshell calcification within the abdomen, compatible with pseudocyst identified on prior CT imaging. IMPRESSION: No radiographic evidence of acute cardiopulmonary disease Electronically Signed   By: Gilmer Mor D.O.   On: 04/05/2017 18:22    Procedures Procedures (  including critical care time)  Medications Ordered in UC Medications  ipratropium-albuterol (DUONEB) 0.5-2.5 (3) MG/3ML nebulizer solution 3 mL (not administered)     Initial Impression / Assessment and Plan / UC Course  I have reviewed the triage vital signs and the nursing notes.  Pertinent labs & imaging results that were available during my care of the patient were reviewed by me and considered in my medical decision making (see chart for details).     PERC negative, CXR negative. Likely a post-viral cough, vs. Cough from mold exposure. Will give breathing treatment today and send home with tessalon. Advised she should try OTC cough medicine for relief as well just monitor BP at home. Discussed strict return precautions. Patient verbalized understanding and is agreeable with plan. Follow up with PCP.   Final Clinical Impressions(s) / UC Diagnoses   Final diagnoses:  Cough    ED Discharge Orders        Ordered    benzonatate (TESSALON) 100 MG capsule  Every 8 hours     04/05/17 1840       Controlled Substance Prescriptions [place] Controlled Substance Registry consulted? Not Applicable   Lew Dawes, [place] 04/05/17 1858

## 2017-04-05 NOTE — ED Triage Notes (Signed)
PT C/O: persistent prod cough associated w/nasal drainage/congestion  Seen here on 03/10/17 for similar sx  DENIES: fevers  TAKING MEDS: albuterol, prednisone she finished  A&O x4... NAD... Ambulatory

## 2017-04-20 ENCOUNTER — Ambulatory Visit: Payer: Commercial Managed Care - HMO | Admitting: Certified Nurse Midwife

## 2017-05-11 ENCOUNTER — Ambulatory Visit: Payer: Commercial Managed Care - HMO | Admitting: Certified Nurse Midwife

## 2017-06-06 DIAGNOSIS — I10 Essential (primary) hypertension: Secondary | ICD-10-CM | POA: Diagnosis not present

## 2017-06-06 DIAGNOSIS — E782 Mixed hyperlipidemia: Secondary | ICD-10-CM | POA: Diagnosis not present

## 2017-06-06 DIAGNOSIS — N183 Chronic kidney disease, stage 3 (moderate): Secondary | ICD-10-CM | POA: Diagnosis not present

## 2017-06-15 ENCOUNTER — Ambulatory Visit (INDEPENDENT_AMBULATORY_CARE_PROVIDER_SITE_OTHER): Payer: 59 | Admitting: Certified Nurse Midwife

## 2017-06-15 ENCOUNTER — Encounter: Payer: Self-pay | Admitting: Certified Nurse Midwife

## 2017-06-15 ENCOUNTER — Ambulatory Visit: Payer: Self-pay | Admitting: Certified Nurse Midwife

## 2017-06-15 ENCOUNTER — Other Ambulatory Visit: Payer: Self-pay

## 2017-06-15 VITALS — BP 138/88 | HR 80 | Resp 16 | Ht 66.5 in | Wt 277.0 lb

## 2017-06-15 DIAGNOSIS — Z8679 Personal history of other diseases of the circulatory system: Secondary | ICD-10-CM

## 2017-06-15 DIAGNOSIS — R635 Abnormal weight gain: Secondary | ICD-10-CM

## 2017-06-15 DIAGNOSIS — Z01419 Encounter for gynecological examination (general) (routine) without abnormal findings: Secondary | ICD-10-CM | POA: Diagnosis not present

## 2017-06-15 DIAGNOSIS — Z0000 Encounter for general adult medical examination without abnormal findings: Secondary | ICD-10-CM

## 2017-06-15 LAB — POCT URINALYSIS DIPSTICK
Bilirubin, UA: NEGATIVE
Blood, UA: NEGATIVE
Glucose, UA: NEGATIVE
Ketones, UA: NEGATIVE
LEUKOCYTES UA: NEGATIVE
NITRITE UA: NEGATIVE
PROTEIN UA: NEGATIVE
Urobilinogen, UA: NEGATIVE E.U./dL — AB
pH, UA: 5 (ref 5.0–8.0)

## 2017-06-15 NOTE — Progress Notes (Signed)
57 y.o. [id number] Married  African American Fe here for annual exam. Menopausal, no vaginal bleeding or vaginal dryness. Occasional hot flash or night sweats. Seeing Dr. Nyra Jabserdine for Hypertension management and Dr. Tiburcio PeaHarris for aex, prn. Still struggling with back pain, had last injection with no change. Considering hydrotherapy or surgery.  Desires labs today, but had some done with Dr Derdine. Will request records to see if she needs additional and will come in if needed. No other health concerns today.  No LMP recorded. Patient is postmenopausal.          Sexually active: No.  The current method of family planning is tubal ligation.    Exercising: No.  exercise Smoker:  no  Health Maintenance: Pap:  2012, 03-16-16 neg HPV HR neg History of Abnormal Pap: no MMG:  02-17-16 category b density birads 1:neg Self Breast exams: yes Colonoscopy:  2017 polyps f/u 6257yrs BMD:   none TDaP:  unsure Shingles: no Pneumonia: no Hep C and HIV: not done Labs: poct urine-neg   reports that she has never smoked. She has never used smokeless tobacco. She reports that she does not drink alcohol or use drugs.  Past Medical History:  Diagnosis Date  . Asthma    as a child  . Hypertension   . Spinal stenosis     Past Surgical History:  Procedure Laterality Date  . CESAREAN SECTION    . DILATION AND CURETTAGE OF UTERUS    . OOPHORECTOMY     both  . TUBAL LIGATION      Current Outpatient Medications  Medication Sig Dispense Refill  . amLODipine (NORVASC) 10 MG tablet Take 1 tablet (10 mg total) by mouth daily. 90 tablet 3  . furosemide (LASIX) 80 MG tablet     . losartan (COZAAR) 100 MG tablet Take 1 tablet by mouth daily.    . Nebivolol HCl 20 MG TABS Take by mouth.    . spironolactone (ALDACTONE) 100 MG tablet Take 1 tablet by mouth daily.     No current facility-administered medications for this visit.     Family History  Problem Relation Age of Onset  . Hypertension Mother   . Diabetes  Mother   . Ovarian cancer Maternal Grandmother   . Hypertension Maternal Grandmother   . Diabetes Maternal Grandmother   . Colon cancer Brother     ROS:  Pertinent items are noted in HPI.  Otherwise, a comprehensive ROS was negative.  Exam:   BP (!) 140/92   Pulse 70   Resp 16   Ht 5' 6.5" (1.689 m)   Wt 277 lb (125.6 kg)   BMI 44.04 kg/m  Height: 5' 6.5" (168.9 cm) Ht Readings from Last 3 Encounters:  06/15/17 5' 6.5" (1.689 m)  03/16/16 5' 6.25" (1.683 m)  02/17/16 5\' 6"  (1.676 m)    General appearance: alert, cooperative and appears stated age Head: Normocephalic, without obvious abnormality, atraumatic Neck: no adenopathy, supple, symmetrical, trachea midline and thyroid normal to inspection and palpation Lungs: clear to auscultation bilaterally Breasts: normal appearance, no masses or tenderness, No nipple retraction or dimpling, No nipple discharge or bleeding, No axillary or supraclavicular adenopathy Heart: regular rate and rhythm Abdomen: soft, non-tender; no masses,  no organomegaly Extremities: extremities normal, atraumatic, no cyanosis or edema Skin: Skin color, texture, turgor normal. No rashes or lesions Lymph nodes: Cervical, supraclavicular, and axillary nodes normal. No abnormal inguinal nodes palpated Neurologic: Grossly normal   Pelvic: External genitalia:  no lesions  Urethra:  normal appearing urethra with no masses, tenderness or lesions              Bartholin's and Skene's: normal                 Vagina: normal appearing vagina with normal color and discharge, no lesions              Cervix: no cervical motion tenderness, no lesions and normal appearance              Pap taken: No. Bimanual Exam:  Uterus:  normal size, contour, position, consistency, mobility, non-tender and mid position              Adnexa: normal adnexa and no mass, fullness, tenderness               Rectovaginal: Confirms               Anus:  normal sphincter tone,  no lesions  Chaperone present: yes  A:  Well Woman with normal exam  Menopausal no HRT  Hypertension/cholesterol management with MD  Weight gain over last year of 13 pounds  History of chronic back pain  Screening labs if needed, will request records of labs done  P:   Reviewed health and wellness pertinent to exam  Discussed need to advise if vaginal bleeding  Discussed importance of follow up with MD as  Indicated, and follow up with hypertension and back issues  Counseled regarding weight loss would be beneficial for both back issues and health issues with hypertension. Discussed portion control and water therapy as possible exercise and help with back issues. Questions addressed.  Pap smear: no   counseled on breast self exam, mammography screening, adequate intake of calcium and vitamin D, diet and exercise  return annually or prn  An After Visit Summary was printed and given to the patient.

## 2017-06-15 NOTE — Patient Instructions (Signed)

## 2017-09-18 DIAGNOSIS — I10 Essential (primary) hypertension: Secondary | ICD-10-CM | POA: Diagnosis not present

## 2017-09-18 DIAGNOSIS — N183 Chronic kidney disease, stage 3 (moderate): Secondary | ICD-10-CM | POA: Diagnosis not present

## 2017-09-18 DIAGNOSIS — D649 Anemia, unspecified: Secondary | ICD-10-CM | POA: Diagnosis not present

## 2017-09-27 ENCOUNTER — Telehealth: Payer: Self-pay | Admitting: *Deleted

## 2017-09-27 NOTE — Telephone Encounter (Signed)
Message left to [facility] [phone].   Need to advised patient Debbi received records from [facility] and patient needs lipid panel, TSH and vitamin d. Need to check with patient and see if she has had these drawn with another MD or will need lab appointment here.

## 2017-10-17 ENCOUNTER — Other Ambulatory Visit: Payer: Self-pay | Admitting: Certified Nurse Midwife

## 2017-10-17 DIAGNOSIS — E663 Overweight: Secondary | ICD-10-CM

## 2017-10-17 DIAGNOSIS — E559 Vitamin D deficiency, unspecified: Secondary | ICD-10-CM

## 2017-10-17 NOTE — Telephone Encounter (Signed)
Left message to call back  

## 2017-10-17 NOTE — Telephone Encounter (Signed)
Routing to Joy to follow up with patient.

## 2017-10-17 NOTE — Telephone Encounter (Signed)
Orders placed.

## 2017-10-17 NOTE — Telephone Encounter (Signed)
Spoke with patient & she said she has not had labwork done for lipids, tsh & vitamin d. Pt scheduled fasting lab appt for tue august 13th at 8:30. Please place lab orders. Routing to PepsiCoDeborah Leonard, CNM

## 2017-10-24 ENCOUNTER — Other Ambulatory Visit (INDEPENDENT_AMBULATORY_CARE_PROVIDER_SITE_OTHER): Payer: 59

## 2017-10-24 ENCOUNTER — Other Ambulatory Visit: Payer: Self-pay

## 2017-10-24 DIAGNOSIS — E663 Overweight: Secondary | ICD-10-CM

## 2017-10-24 DIAGNOSIS — E559 Vitamin D deficiency, unspecified: Secondary | ICD-10-CM

## 2017-10-25 LAB — LIPID PANEL
CHOL/HDL RATIO: 6.5 ratio — AB (ref 0.0–4.4)
Cholesterol, Total: 311 mg/dL — ABNORMAL HIGH (ref 100–199)
HDL: 48 mg/dL (ref >39)
LDL Calculated: 219 mg/dL — ABNORMAL HIGH (ref 0–99)
TRIGLYCERIDES: 218 mg/dL — AB (ref 0–149)
VLDL Cholesterol Cal: 44 mg/dL — ABNORMAL HIGH (ref 5–40)

## 2017-10-25 LAB — TSH: TSH: 2.47 u[IU]/mL (ref 0.450–4.500)

## 2017-10-25 LAB — VITAMIN D 25 HYDROXY (VIT D DEFICIENCY, FRACTURES): Vit D, 25-Hydroxy: 9.6 ng/mL — ABNORMAL LOW (ref 30.0–100.0)

## 2017-10-26 ENCOUNTER — Telehealth: Payer: Self-pay | Admitting: *Deleted

## 2017-10-26 DIAGNOSIS — E559 Vitamin D deficiency, unspecified: Secondary | ICD-10-CM

## 2017-10-26 MED ORDER — VITAMIN D (ERGOCALCIFEROL) 1.25 MG (50000 UNIT) PO CAPS: 50000.0000 [IU] | ORAL_CAPSULE | ORAL | 0 refills | Status: DC

## 2017-10-26 NOTE — Telephone Encounter (Signed)
-----   Message from Verner Choleborah S Leonard, CNM sent at 10/26/2017  9:57 AM EDT ----- Notify patient that Vitamin D is very low which can contribute to fatigue and depression needs Rx 50,000 once weekly for 3 months and recheck, please schedule TSH is normal  Lipid panel is very elevated with cholesterol at 311 < 199 normal, triglycerides 218 normal < 218 VLDL at 44 normal < 40 LDL at 219 normal < 99 Patient needs to follow up with her cardiologist ? Or PCP for management of elevation. Start working on diet with decrease carbohydrates and fats and increase colorful vegetables and lean protein, Daily walking for exercise.

## 2017-10-26 NOTE — Telephone Encounter (Signed)
Call to patient. All results reviewed with patient and she verbalized understanding. Pharmacy on file confirmed. Prescription for vitamin d 50,000 IU, #12, 0RF sent to pharmacy on file. Instructions on use reviewed with patient and she verbalized understanding. Future order placed for lab work. 3 month lab recheck scheduled for 01-29-18 at 0900. Patient agreeable to date and time of appointment. Patient states she will follow up with PCP, Alexandra Chang, in regards to elevated lipid panel. Advised patient records would be faxed for her. Patient agreeable.   Routing to provider for final review. Patient agreeable to disposition. Will close encounter.

## 2017-12-20 ENCOUNTER — Other Ambulatory Visit: Payer: Self-pay | Admitting: Family Medicine

## 2017-12-20 DIAGNOSIS — Z1231 Encounter for screening mammogram for malignant neoplasm of breast: Secondary | ICD-10-CM

## 2018-01-04 DIAGNOSIS — N183 Chronic kidney disease, stage 3 (moderate): Secondary | ICD-10-CM | POA: Diagnosis not present

## 2018-01-04 DIAGNOSIS — I10 Essential (primary) hypertension: Secondary | ICD-10-CM | POA: Diagnosis not present

## 2018-01-04 DIAGNOSIS — E1122 Type 2 diabetes mellitus with diabetic chronic kidney disease: Secondary | ICD-10-CM | POA: Diagnosis not present

## 2018-01-15 DIAGNOSIS — M48061 Spinal stenosis, lumbar region without neurogenic claudication: Secondary | ICD-10-CM | POA: Diagnosis not present

## 2018-01-29 ENCOUNTER — Other Ambulatory Visit (INDEPENDENT_AMBULATORY_CARE_PROVIDER_SITE_OTHER): Payer: 59

## 2018-01-29 DIAGNOSIS — E559 Vitamin D deficiency, unspecified: Secondary | ICD-10-CM | POA: Diagnosis not present

## 2018-01-30 ENCOUNTER — Other Ambulatory Visit: Payer: Self-pay | Admitting: Certified Nurse Midwife

## 2018-01-30 DIAGNOSIS — E559 Vitamin D deficiency, unspecified: Secondary | ICD-10-CM

## 2018-01-30 LAB — VITAMIN D 25 HYDROXY (VIT D DEFICIENCY, FRACTURES): VIT D 25 HYDROXY: 18.1 ng/mL — AB (ref 30.0–100.0)

## 2018-01-31 ENCOUNTER — Other Ambulatory Visit: Payer: Self-pay

## 2018-01-31 MED ORDER — VITAMIN D (ERGOCALCIFEROL) 1.25 MG (50000 UNIT) PO CAPS: 50000.0000 [IU] | ORAL_CAPSULE | ORAL | 0 refills | Status: DC

## 2018-01-31 NOTE — Telephone Encounter (Signed)
Patient notified & appt scheduled. rx sent to pharmacy

## 2018-01-31 NOTE — Telephone Encounter (Signed)
Patient returning call to Joy.  

## 2018-01-31 NOTE — Telephone Encounter (Signed)
Left message to callback to give her vitamin d lab results. Patient needs to take vitamin d 50,000iu once weekly for then have it rechecked. Refer to lab.

## 2018-02-12 DIAGNOSIS — D631 Anemia in chronic kidney disease: Secondary | ICD-10-CM | POA: Diagnosis not present

## 2018-02-12 DIAGNOSIS — N183 Chronic kidney disease, stage 3 (moderate): Secondary | ICD-10-CM | POA: Diagnosis not present

## 2018-02-12 DIAGNOSIS — I10 Essential (primary) hypertension: Secondary | ICD-10-CM | POA: Diagnosis not present

## 2018-02-12 DIAGNOSIS — N2581 Secondary hyperparathyroidism of renal origin: Secondary | ICD-10-CM | POA: Diagnosis not present

## 2018-02-20 ENCOUNTER — Ambulatory Visit (HOSPITAL_COMMUNITY)
Admission: EM | Admit: 2018-02-20 | Discharge: 2018-02-20 | Disposition: A | Discharge status: Home / Self Care | Payer: 59 | Attending: Physician Assistant | Admitting: Physician Assistant

## 2018-02-20 ENCOUNTER — Encounter (HOSPITAL_COMMUNITY): Payer: Self-pay | Admitting: Emergency Medicine

## 2018-02-20 ENCOUNTER — Other Ambulatory Visit: Payer: Self-pay

## 2018-02-20 DIAGNOSIS — M25561 Pain in right knee: Secondary | ICD-10-CM | POA: Diagnosis not present

## 2018-02-20 MED ORDER — MELOXICAM 7.5 MG PO TABS: 7.5000 mg | ORAL_TABLET | Freq: Every day | ORAL | 0 refills | Status: DC

## 2018-02-20 NOTE — ED Triage Notes (Signed)
PT reports knee pain for 3-4 weeks. PT twisted it and pain has worsened since then

## 2018-02-20 NOTE — ED Provider Notes (Signed)
[facility]    CSN: [id number] Arrival date & time: 02/20/18  1355     History   Chief Complaint Chief Complaint  Patient presents with  . Knee Pain    HPI Alexandra Chang is a 57 y.o. female.   57 year old female comes in for right knee pain for the past 3 to 4 weeks.  States had twisted knee 3 to 4 weeks ago, and since then had pain.  Pain is mostly to the posterior knee, although can also affect medial side of the knee.  States pain can radiate down to the leg.  Denies pain at rest, pain mostly with weightbearing.  Denies swelling, erythema, warmth. Denies numbness/tingling. Took ibuprofen once without relief.     Past Medical History:  Diagnosis Date  . Asthma    as a child  . Hypertension   . Spinal stenosis     Patient Active Problem List   Diagnosis Date Noted  . Hyperlipidemia 02/24/2016  . Essential hypertension 02/24/2016  . NONSPECIFIC ABN FINDING RAD & OTH EXAM GI TRACT 12/31/2008    Past Surgical History:  Procedure Laterality Date  . CESAREAN SECTION    . DILATION AND CURETTAGE OF UTERUS    . OOPHORECTOMY     both  . TUBAL LIGATION      OB History    Gravida  3   Para      Term      Preterm      AB  1   Living  2     SAB  1   TAB      Ectopic      Multiple      Live Births               Home Medications    Prior to Admission medications   Medication Sig Start Date End Date Taking? Authorizing Provider  amLODipine (NORVASC) 10 MG tablet Take 1 tablet (10 mg total) by mouth daily. 02/24/16   Duke SalviaKlein, Steven C, MD  furosemide (LASIX) 80 MG tablet  03/11/16   [provider]  losartan (COZAAR) 100 MG tablet Take 1 tablet by mouth daily. 02/15/16   [provider]  meloxicam (MOBIC) 7.5 MG tablet Take 1 tablet (7.5 mg total) by mouth daily. 02/20/18   Cathie HoopsYu, Toussaint Golson V, PA-C  Nebivolol HCl 20 MG TABS Take by mouth.    [provider]  spironolactone (ALDACTONE) 100 MG tablet Take 1 tablet by  mouth daily. 02/15/16   [provider]  Vitamin D, Ergocalciferol, (DRISDOL) 1.25 MG (50000 UT) CAPS capsule Take 1 capsule (50,000 Units total) by mouth every 7 (seven) days. 01/31/18   Verner CholLeonard, Deborah S, CNM  Vitamin D, Ergocalciferol, (DRISDOL) 50000 units CAPS capsule Take 1 capsule (50,000 Units total) by mouth every 7 (seven) days. 10/26/17   Verner CholLeonard, Deborah S, CNM    Family History Family History  Problem Relation Age of Onset  . Hypertension Mother   . Diabetes Mother   . Ovarian cancer Maternal Grandmother   . Hypertension Maternal Grandmother   . Diabetes Maternal Grandmother   . Colon cancer Brother     Social History Social History   Tobacco Use  . Smoking status: Never Smoker  . Smokeless tobacco: Never Used  Substance Use Topics  . Alcohol use: No  . Drug use: No     Allergies   Statins and Lisinopril   Review of Systems Review of Systems  Reason unable to  perform ROS: See HPI as above.     Physical Exam Triage Vital Signs ED Triage Vitals  Enc Vitals Group     BP 02/20/18 1445 (!) 151/88     Pulse Rate 02/20/18 1444 73     Resp 02/20/18 1444 16     Temp 02/20/18 1444 98.1 F (36.7 C)     Temp src --      SpO2 02/20/18 1444 97 %     Weight --      Height --      Head Circumference --      Peak Flow --      Pain Score 02/20/18 1442 10     Pain Loc --      Pain Edu? --      Excl. in GC? --    No data found.  Updated Vital Signs BP (!) 151/88   Pulse 73   Temp 98.1 F (36.7 C)   Resp 16   SpO2 97%   Physical Exam  Constitutional: She is oriented to person, place, and time. She appears well-developed and well-nourished. No distress.  HENT:  Head: Normocephalic and atraumatic.  Eyes: Pupils are equal, round, and reactive to light. Conjunctivae are normal.  Musculoskeletal:  No obvious swelling, erythema, warmth, contusion.  Mild tenderness to palpation of medial joint line.  No tenderness to palpation of posterior knee.   Full range of motion, though pain with flexion.  Strength normal and equal bilaterally.  Sensation intact and equal bilaterally.  Pedal pulse 2+.  Negative Homans.  Neurological: She is alert and oriented to person, place, and time.  Skin: She is not diaphoretic.   UC Treatments / Results  Labs (all labs ordered are listed, but only abnormal results are displayed) Labs Reviewed - No data to display  EKG None  Radiology No results found.  Procedures Procedures (including critical care time)  Medications Ordered in UC Medications - No data to display  Initial Impression / Assessment and Plan / UC Course  I have reviewed the triage vital signs and the nursing notes.  Pertinent labs & imaging results that were available during my care of the patient were reviewed by me and considered in my medical decision making (see chart for details).    Discussed cannot rule out meniscus/ligament injury.  No indications for x-ray today.  Will try NSAIDs, ice compress, elevation, knee sleeve during activity.  Patient to follow-up with orthopedics for further evaluation if symptoms not improving.  Final Clinical Impressions(s) / UC Diagnoses   Final diagnoses:  Acute pain of right knee    ED Prescriptions    Medication Sig Dispense Auth. Provider   meloxicam (MOBIC) 7.5 MG tablet Take 1 tablet (7.5 mg total) by mouth daily. 15 tablet Threasa Alpha, [place] 02/20/18 1714

## 2018-02-20 NOTE — Discharge Instructions (Signed)
Start Mobic. Do not take ibuprofen (motrin/advil)/ naproxen (aleve) while on mobic. Ice compress, elevation, knee sleeve during activity. This may take a few weeks to resolve, but should be feeling better each week. Follow up with orthopedics for further evaluation if symptoms not improving.

## 2018-02-27 DIAGNOSIS — M25561 Pain in right knee: Secondary | ICD-10-CM | POA: Diagnosis not present

## 2018-02-27 DIAGNOSIS — M4316 Spondylolisthesis, lumbar region: Secondary | ICD-10-CM | POA: Diagnosis not present

## 2018-02-27 DIAGNOSIS — M48061 Spinal stenosis, lumbar region without neurogenic claudication: Secondary | ICD-10-CM | POA: Diagnosis not present

## 2018-02-27 DIAGNOSIS — M545 Low back pain: Secondary | ICD-10-CM | POA: Diagnosis not present

## 2018-03-02 DIAGNOSIS — M48061 Spinal stenosis, lumbar region without neurogenic claudication: Secondary | ICD-10-CM | POA: Insufficient documentation

## 2018-03-02 DIAGNOSIS — R638 Other symptoms and signs concerning food and fluid intake: Secondary | ICD-10-CM | POA: Insufficient documentation

## 2018-03-02 DIAGNOSIS — M545 Low back pain, unspecified: Secondary | ICD-10-CM | POA: Insufficient documentation

## 2018-03-13 ENCOUNTER — Ambulatory Visit
Admission: RE | Admit: 2018-03-13 | Discharge: 2018-03-13 | Disposition: A | Discharge status: Home / Self Care | Payer: 59 | Source: Ambulatory Visit | Attending: Family Medicine | Admitting: Family Medicine

## 2018-03-13 ENCOUNTER — Other Ambulatory Visit: Payer: Self-pay | Admitting: Family Medicine

## 2018-03-13 DIAGNOSIS — Z1231 Encounter for screening mammogram for malignant neoplasm of breast: Secondary | ICD-10-CM

## 2018-03-19 DIAGNOSIS — E1122 Type 2 diabetes mellitus with diabetic chronic kidney disease: Secondary | ICD-10-CM | POA: Diagnosis not present

## 2018-03-19 DIAGNOSIS — M25561 Pain in right knee: Secondary | ICD-10-CM | POA: Diagnosis not present

## 2018-03-19 DIAGNOSIS — M545 Low back pain: Secondary | ICD-10-CM | POA: Diagnosis not present

## 2018-04-02 DIAGNOSIS — M545 Low back pain: Secondary | ICD-10-CM | POA: Diagnosis not present

## 2018-04-02 DIAGNOSIS — M48061 Spinal stenosis, lumbar region without neurogenic claudication: Secondary | ICD-10-CM | POA: Diagnosis not present

## 2018-04-02 DIAGNOSIS — M25561 Pain in right knee: Secondary | ICD-10-CM | POA: Diagnosis not present

## 2018-04-11 DIAGNOSIS — N183 Chronic kidney disease, stage 3 (moderate): Secondary | ICD-10-CM | POA: Diagnosis not present

## 2018-04-11 DIAGNOSIS — E782 Mixed hyperlipidemia: Secondary | ICD-10-CM | POA: Diagnosis not present

## 2018-04-11 DIAGNOSIS — Z01818 Encounter for other preprocedural examination: Secondary | ICD-10-CM | POA: Diagnosis not present

## 2018-04-11 DIAGNOSIS — E1122 Type 2 diabetes mellitus with diabetic chronic kidney disease: Secondary | ICD-10-CM | POA: Diagnosis not present

## 2018-04-19 ENCOUNTER — Encounter: Payer: Self-pay | Admitting: Cardiology

## 2018-04-19 ENCOUNTER — Encounter: Payer: Self-pay | Admitting: *Deleted

## 2018-04-19 ENCOUNTER — Ambulatory Visit: Payer: 59 | Admitting: Cardiology

## 2018-04-19 VITALS — BP 128/82 | HR 77 | Ht 66.5 in | Wt 274.0 lb

## 2018-04-19 DIAGNOSIS — Z01810 Encounter for preprocedural cardiovascular examination: Secondary | ICD-10-CM | POA: Diagnosis not present

## 2018-04-19 DIAGNOSIS — I10 Essential (primary) hypertension: Secondary | ICD-10-CM

## 2018-04-19 DIAGNOSIS — E78019 Familial hypercholesterolemia, unspecified: Secondary | ICD-10-CM

## 2018-04-19 DIAGNOSIS — E7801 Familial hypercholesterolemia: Secondary | ICD-10-CM

## 2018-04-19 NOTE — Patient Instructions (Signed)
Medication Instructions:  Your physician recommends that you continue on your current medications as directed. Please refer to the Current Medication list given to you today.  If you need a refill on your cardiac medications before your next appointment, please call your pharmacy.   Lab work: None  If you have labs (blood work) drawn today and your tests are completely normal, you will receive your results only by: Marland Kitchen MyChart Message (if you have MyChart) OR . A paper copy in the mail If you have any lab test that is abnormal or we need to change your treatment, we will call you to review the results.  Testing/Procedures: You had an EKG today.  Your physician has requested that you have an echocardiogram. Echocardiography is a painless test that uses sound waves to create images of your heart. It provides your doctor with information about the size and shape of your heart and how well your heart's chambers and valves are working. This procedure takes approximately one hour. There are no restrictions for this procedure.  Your physician has requested that you have a lexiscan myoview. For further information please visit [email or web address] Please follow instruction sheet, as given.  Follow-Up: At [facility], you and your health needs are our priority.  As part of our continuing mission to provide you with exceptional heart care, we have created designated Provider Care Teams.  These Care Teams include your primary Cardiologist (physician) and Advanced Practice Providers (APPs -  Physician Assistants and Nurse Practitioners) who all work together to provide you with the care you need, when you need it. You will need a follow up appointment in 3 months.  Please call our office 2 months in advance to schedule this appointment.     Echocardiogram An echocardiogram is a procedure that uses painless sound waves (ultrasound) to produce an image of the heart. Images from an echocardiogram can  provide important information about:  Signs of coronary artery disease (CAD).  Aneurysm detection. An aneurysm is a weak or damaged part of an artery wall that bulges out from the normal force of blood pumping through the body.  Heart size and shape. Changes in the size or shape of the heart can be associated with certain conditions, including heart failure, aneurysm, and CAD.  Heart muscle function.  Heart valve function.  Signs of a past heart attack.  Fluid buildup around the heart.  Thickening of the heart muscle.  A tumor or infectious growth around the heart valves. Tell a health care provider about:  Any allergies you have.  All medicines you are taking, including vitamins, herbs, eye drops, creams, and over-the-counter medicines.  Any blood disorders you have.  Any surgeries you have had.  Any medical conditions you have.  Whether you are pregnant or may be pregnant. What are the risks? Generally, this is a safe procedure. However, problems may occur, including:  Allergic reaction to dye (contrast) that may be used during the procedure. What happens before the procedure? No specific preparation is needed. You may eat and drink normally. What happens during the procedure?   An IV tube may be inserted into one of your veins.  You may receive contrast through this tube. A contrast is an injection that improves the quality of the pictures from your heart.  A gel will be applied to your chest.  A wand-like tool (transducer) will be moved over your chest. The gel will help to transmit the sound waves from the transducer.  The sound  waves will harmlessly bounce off of your heart to allow the heart images to be captured in real-time motion. The images will be recorded on a computer. The procedure may vary among health care providers and hospitals. What happens after the procedure?  You may return to your normal, everyday life, including diet, activities, and  medicines, unless your health care provider tells you not to do that. Summary  An echocardiogram is a procedure that uses painless sound waves (ultrasound) to produce an image of the heart.  Images from an echocardiogram can provide important information about the size and shape of your heart, heart muscle function, heart valve function, and fluid buildup around your heart.  You do not need to do anything to prepare before this procedure. You may eat and drink normally.  After the echocardiogram is completed, you may return to your normal, everyday life, unless your health care provider tells you not to do that. This information is not intended to replace advice given to you by your health care provider. Make sure you discuss any questions you have with your health care provider. Document Released: 02/26/2000 Document Revised: 04/02/2016 Document Reviewed: 04/02/2016 Elsevier Interactive Patient Education  2019 Elsevier Inc.    Regadenoson injection What is this medicine? REGADENOSON is used to test the heart for coronary artery disease. It is used in patients who can not exercise for their stress test. This medicine may be used for other purposes; ask your health care provider or pharmacist if you have questions. COMMON BRAND NAME(S): Lexiscan What should I tell my health care provider before I take this medicine? They need to know if you have any of these conditions: -heart problems -lung or breathing disease, like asthma or COPD -an unusual or allergic reaction to regadenoson, other medicines, foods, dyes, or preservatives -pregnant or trying to get pregnant -breast-feeding How should I use this medicine? This medicine is for injection into a vein. It is given by a health care professional in a hospital or clinic setting. Talk to your pediatrician regarding the use of this medicine in children. Special care may be needed. Overdosage: If you think you have taken too much of this  medicine contact a poison control center or emergency room at once. NOTE: This medicine is only for you. Do not share this medicine with others. What if I miss a dose? This does not apply. What may interact with this medicine? -caffeine -dipyridamole -guarana -theophylline This list may not describe all possible interactions. Give your health care provider a list of all the medicines, herbs, non-prescription drugs, or dietary supplements you use. Also tell them if you smoke, drink alcohol, or use illegal drugs. Some items may interact with your medicine. What should I watch for while using this medicine? Your condition will be monitored carefully while you are receiving this medicine. Do not take medicines, foods, or drinks with caffeine (like coffee, tea, or colas) for at least 12 hours before your test. If you do not know if something contains caffeine, ask your health care professional. What side effects may I notice from receiving this medicine? Side effects that you should report to your doctor or health care professional as soon as possible: -allergic reactions like skin rash, itching or hives, swelling of the face, lips, or tongue -breathing problems -chest pain, tightness or palpitations -severe headache Side effects that usually do not require medical attention (report to your doctor or health care professional if they continue or are bothersome): -flushing -headache -irritation or  pain at site where injected -nausea, vomiting This list may not describe all possible side effects. Call your doctor for medical advice about side effects. You may report side effects to FDA at 1-800-FDA-1088. Where should I keep my medicine? This drug is given in a hospital or clinic and will not be stored at home. NOTE: This sheet is a summary. It may not cover all possible information. If you have questions about this medicine, talk to your doctor, pharmacist, or health care provider.  2019  Elsevier/Gold Standard (2007-10-29 15:08:13)     Cardiac Nuclear Scan A cardiac nuclear scan is a test that is done to check the flow of blood to your heart. It is done when you are resting and when you are exercising. The test looks for problems such as:  Not enough blood reaching a portion of the heart.  The heart muscle not working as it should. You may need this test if:  You have heart disease.  You have had lab results that are not normal.  You have had heart surgery or a balloon procedure to open up blocked arteries (angioplasty).  You have chest pain.  You have shortness of breath. In this test, a special dye (tracer) is put into your bloodstream. The tracer will travel to your heart. A camera will then take pictures of your heart to see how the tracer moves through your heart. This test is usually done at a hospital and takes 2-4 hours. Tell a doctor about:  Any allergies you have.  All medicines you are taking, including vitamins, herbs, eye drops, creams, and over-the-counter medicines.  Any problems you or family members have had with anesthetic medicines.  Any blood disorders you have.  Any surgeries you have had.  Any medical conditions you have.  Whether you are pregnant or may be pregnant. What are the risks? Generally, this is a safe test. However, problems may occur, such as:  Serious chest pain and heart attack. This is only a risk if the stress portion of the test is done.  Rapid heartbeat.  A feeling of warmth in your chest. This feeling usually does not last long.  Allergic reaction to the tracer. What happens before the test?  Ask your doctor about changing or stopping your normal medicines. This is important.  Follow instructions from your doctor about what you cannot eat or drink.  Remove your jewelry on the day of the test. What happens during the test?  An IV tube will be inserted into one of your veins.  Your doctor will give you  a small amount of tracer through the IV tube.  You will wait for 20-40 minutes while the tracer moves through your bloodstream.  Your heart will be monitored with an electrocardiogram (ECG).  You will lie down on an exam table.  Pictures of your heart will be taken for about 15-20 minutes.  You may also have a stress test. For this test, one of these things may be done: ? You will be asked to exercise on a treadmill or a stationary bike. ? You will be given medicines that will make your heart work harder. This is done if you are unable to exercise.  When blood flow to your heart has peaked, a tracer will again be given through the IV tube.  After 20-40 minutes, you will get back on the exam table. More pictures will be taken of your heart.  Depending on the tracer that is used, more pictures  may need to be taken 3-4 hours later.  Your IV tube will be removed when the test is over. The test may vary among doctors and hospitals. What happens after the test?  Ask your doctor: ? Whether you can return to your normal schedule, including diet, activities, and medicines. ? Whether you should drink more fluids. This will help to remove the tracer from your body. Drink enough fluid to keep your pee (urine) pale yellow.  Ask your doctor, or the department that is doing the test: ? When will my results be ready? ? How will I get my results? Summary  A cardiac nuclear scan is a test that is done to check the flow of blood to your heart.  Tell your doctor whether you are pregnant or may be pregnant.  Before the test, ask your doctor about changing or stopping your normal medicines. This is important.  Ask your doctor whether you can return to your normal activities. You may be asked to drink more fluids. This information is not intended to replace advice given to you by your health care provider. Make sure you discuss any questions you have with your health care provider. Document  Released: 08/14/2017 Document Revised: 08/14/2017 Document Reviewed: 08/14/2017 Elsevier Interactive Patient Education  2019 [employer].

## 2018-04-19 NOTE — Progress Notes (Signed)
Cardiology Office Note:    Date:  04/19/2018   ID:  Alexandra Chang, DOB 1960-11-10, MRN [id number]  PCP:  Johny BlamerHarris, William, MD  Cardiologist:  Garwin Brothersajan R Chike Farrington, MD   Referring MD: Johny BlamerHarris, William, MD    ASSESSMENT:    1. Pre-operative cardiovascular examination   2. Essential hypertension   3. Familial hypercholesterolemia   4. Morbid obesity (HCC)    PLAN:    In order of problems listed above:  1. Primary prevention stressed with the patient.  Importance of compliance with diet and medication stressed and she vocalized understanding.  Her blood pressure stable.  Diet was discussed for dyslipidemia and obesity and risks of obesity explained.  I told her that she would need aggressive lipid lowering to reduce risk factors for coronary artery disease and that I will be more than glad to help her with with that after she is stable after the surgery. 2. Echocardiogram will be done to assess murmur heard on auscultation. 3. She will undergo Lexiscan sestamibi to assess risk factors for coronary artery disease and to assess objective evidence of any coronary artery disease that might be obstructive.  She is agreeable with this.  If this test is negative then she is not at high risk for coronary events during the aforementioned surgery.  Meticulous hemodynamic monitoring and continued uninterrupted perioperative beta-blockade will further reduce the risk of coronary events. 4. Patient will be seen in follow-up appointment in 6 months or earlier if the patient has any concerns    Medication Adjustments/Labs and Tests Ordered: Current medicines are reviewed at length with the patient today.  Concerns regarding medicines are outlined above.  Orders Placed This Encounter  Procedures  . MYOCARDIAL PERFUSION IMAGING  . EKG 12-Lead  . ECHOCARDIOGRAM COMPLETE   No orders of the defined types were placed in this encounter.    History of Present Illness:    Alexandra Chang is a 58 y.o. female  who is being seen today for the evaluation of preoperative cardiovascular assessment at the request of Johny BlamerHarris, William, MD.  Patient is a pleasant 58 year old female.  She has past medical history of market hyperlipidemia, essential hypertension and obesity.  She leads a sedentary lifestyle because of orthopedic issues.  She is planning to undergo joint replacement surgery and is referred here.  She denies any chest pain orthopnea or PND.  At the time of my evaluation, the patient is alert awake oriented and in no distress.  Past Medical History:  Diagnosis Date  . Asthma    as a child  . Hypertension   . Spinal stenosis     Past Surgical History:  Procedure Laterality Date  . CESAREAN SECTION    . DILATION AND CURETTAGE OF UTERUS    . OOPHORECTOMY     both  . TUBAL LIGATION      Current Medications: Current Meds  Medication Sig  . amLODipine (NORVASC) 10 MG tablet Take 1 tablet (10 mg total) by mouth daily.  . furosemide (LASIX) 80 MG tablet Take 80 mg by mouth 2 (two) times daily.   Marland Kitchen. losartan (COZAAR) 100 MG tablet Take 1 tablet by mouth daily.  . Nebivolol HCl 20 MG TABS Take 1 tablet by mouth daily.   Marland Kitchen. spironolactone (ALDACTONE) 100 MG tablet Take 50 mg by mouth 2 (two) times daily.   . Vitamin D, Ergocalciferol, (DRISDOL) 1.25 MG (50000 UT) CAPS capsule Take 1 capsule (50,000 Units total) by mouth every 7 (seven) days.  Allergies:   Statins and Lisinopril   Social History   Socioeconomic History  . Marital status: Married    Spouse name: Not on file  . Number of children: Not on file  . Years of education: Not on file  . Highest education level: Not on file  Occupational History  . Not on file  Social Needs  . Financial resource strain: Not on file  . Food insecurity:    Worry: Not on file    Inability: Not on file  . Transportation needs:    Medical: Not on file    Non-medical: Not on file  Tobacco Use  . Smoking status: Never Smoker  . Smokeless  tobacco: Never Used  Substance and Sexual Activity  . Alcohol use: No  . Drug use: No  . Sexual activity: Not Currently    Birth control/protection: Post-menopausal, Surgical    Comment: BTL  Lifestyle  . Physical activity:    Days per week: Not on file    Minutes per session: Not on file  . Stress: Not on file  Relationships  . Social connections:    Talks on phone: Not on file    Gets together: Not on file    Attends religious service: Not on file    Active member of club or organization: Not on file    Attends meetings of clubs or organizations: Not on file    Relationship status: Not on file  Other Topics Concern  . Not on file  Social History Narrative  . Not on file     Family History: The patient's family history includes Colon cancer in her brother; Diabetes in her maternal grandmother and mother; Hypertension in her maternal grandmother and mother; Ovarian cancer in her maternal grandmother.  ROS:   Please see the history of present illness.    All other systems reviewed and are negative.  EKGs/Labs/Other Studies Reviewed:    The following studies were reviewed today: I discussed my findings with the patient at extensive length.  EKG reveals sinus rhythm and nonspecific ST changes.   Recent Labs: 10/24/2017: TSH 2.470  Recent Lipid Panel    Component Value Date/Time   CHOL 311 (H) 10/24/2017 0826   TRIG 218 (H) 10/24/2017 0826   HDL 48 10/24/2017 0826   CHOLHDL 6.5 (H) 10/24/2017 0826   LDLCALC 219 (H) 10/24/2017 0826    Physical Exam:    VS:  BP 128/82 (BP Location: Right Arm, Patient Position: Sitting, Cuff Size: Normal)   Pulse 77   Ht 5' 6.5" (1.689 m)   Wt 274 lb (124.3 kg)   SpO2 98%   BMI 43.56 kg/m     Wt Readings from Last 3 Encounters:  04/19/18 274 lb (124.3 kg)  06/15/17 277 lb (125.6 kg)  03/16/16 264 lb (119.7 kg)     GEN: Patient is in no acute distress HEENT: Normal NECK: No JVD; No carotid bruits LYMPHATICS: No  lymphadenopathy CARDIAC: S1 S2 regular, 2/6 systolic murmur at the apex. RESPIRATORY:  Clear to auscultation without rales, wheezing or rhonchi  ABDOMEN: Soft, non-tender, non-distended MUSCULOSKELETAL:  No edema; No deformity  SKIN: Warm and dry NEUROLOGIC:  Alert and oriented x 3 PSYCHIATRIC:  Normal affect    Signed, Garwin Brothersajan R Pakou Rainbow, MD  04/19/2018 11:12 AM    [facility]

## 2018-04-24 ENCOUNTER — Ambulatory Visit (HOSPITAL_COMMUNITY): Payer: 59 | Attending: Cardiology

## 2018-04-24 DIAGNOSIS — Z01810 Encounter for preprocedural cardiovascular examination: Secondary | ICD-10-CM | POA: Insufficient documentation

## 2018-04-24 DIAGNOSIS — I10 Essential (primary) hypertension: Secondary | ICD-10-CM | POA: Insufficient documentation

## 2018-05-10 ENCOUNTER — Telehealth (HOSPITAL_COMMUNITY): Payer: Self-pay | Admitting: *Deleted

## 2018-05-10 NOTE — Telephone Encounter (Signed)
Patient given detailed instructions per Myocardial Perfusion Study Information Sheet for the test on 05/14/18 at 1:15. Patient notified to arrive 15 minutes early and that it is imperative to arrive on time for appointment to keep from having the test rescheduled.  If you need to cancel or reschedule your appointment, please call the office within 24 hours of your appointment. . Patient verbalized understanding.Alexandra Chang

## 2018-05-14 ENCOUNTER — Ambulatory Visit (HOSPITAL_COMMUNITY): Payer: 59 | Attending: Cardiovascular Disease

## 2018-05-14 VITALS — Ht 67.0 in | Wt 274.0 lb

## 2018-05-14 DIAGNOSIS — R0600 Dyspnea, unspecified: Secondary | ICD-10-CM | POA: Diagnosis not present

## 2018-05-14 DIAGNOSIS — Z01810 Encounter for preprocedural cardiovascular examination: Secondary | ICD-10-CM | POA: Insufficient documentation

## 2018-05-14 MED ORDER — REGADENOSON 0.4 MG/5ML IV SOLN
0.4000 mg | Freq: Once | INTRAVENOUS | Status: AC
  Administered 2018-05-14: 0.4 mg via INTRAVENOUS

## 2018-05-14 MED ORDER — AMINOPHYLLINE 25 MG/ML IV SOLN
75.0000 mg | Freq: Once | INTRAVENOUS | Status: AC
  Administered 2018-05-14: 75 mg via INTRAVENOUS

## 2018-05-14 MED ORDER — TECHNETIUM TC 99M TETROFOSMIN IV KIT
32.1000 | PACK | Freq: Once | INTRAVENOUS | Status: AC | PRN
  Administered 2018-05-14: 32.1 via INTRAVENOUS
  Filled 2018-05-14: qty 33

## 2018-05-15 ENCOUNTER — Ambulatory Visit (HOSPITAL_COMMUNITY): Payer: 59

## 2018-05-15 ENCOUNTER — Ambulatory Visit (HOSPITAL_COMMUNITY): Payer: 59 | Attending: Cardiology

## 2018-05-15 ENCOUNTER — Telehealth: Payer: Self-pay

## 2018-05-15 LAB — MYOCARDIAL PERFUSION IMAGING
CHL CUP RESTING HR STRESS: 80 {beats}/min
LV dias vol: 71 mL (ref 46–106)
LV sys vol: 30 mL
Peak HR: 104 {beats}/min
SDS: 4
SRS: 0
SSS: 4
TID: 0.95

## 2018-05-15 MED ORDER — TECHNETIUM TC 99M TETROFOSMIN IV KIT
32.6000 | PACK | Freq: Once | INTRAVENOUS | Status: AC | PRN
  Administered 2018-05-15: 32.6 via INTRAVENOUS
  Filled 2018-05-15: qty 33

## 2018-05-15 NOTE — Telephone Encounter (Signed)
-----   Message from Garwin Brothers, MD sent at 05/15/2018 12:52 PM EST ----- The results of the study is unremarkable. Please inform patient. I will discuss in detail at next appointment. Cc  primary care/referring physician Garwin Brothers, MD 05/15/2018 12:51 PM

## 2018-05-15 NOTE — Telephone Encounter (Signed)
Patient called and notified of test results. 

## 2018-05-29 ENCOUNTER — Telehealth: Payer: Self-pay | Admitting: Cardiology

## 2018-05-29 NOTE — Telephone Encounter (Signed)
Patient states [facility] is still waiting for her pre-op clearance authorization from [employer], Please advise.

## 2018-05-29 NOTE — Telephone Encounter (Signed)
Faxed over pre op forms for her surgery task complete.

## 2018-05-31 ENCOUNTER — Telehealth: Payer: Self-pay | Admitting: Certified Nurse Midwife

## 2018-05-31 NOTE — Telephone Encounter (Signed)
Patient canceled her upcoming lab appointment 06/04/18. She will call later to reschedule.

## 2018-06-04 ENCOUNTER — Other Ambulatory Visit: Payer: 59

## 2018-07-04 ENCOUNTER — Ambulatory Visit: Payer: 59 | Admitting: Certified Nurse Midwife

## 2018-08-15 ENCOUNTER — Encounter: Payer: Self-pay | Admitting: Obstetrics and Gynecology

## 2018-08-15 NOTE — Telephone Encounter (Signed)
This is Dr. Edward Jolly covering for Alexandra Chang while she is out of the office.  Encounter reviewed and closed.  Patient has an appointment for annual exam this month with Alexandra Chang. She can do her lab visit then.

## 2018-08-15 NOTE — Telephone Encounter (Signed)
Left a message foe patient to call and reschedule her vitamin d appointment. Okay to close encounter?

## 2018-08-25 ENCOUNTER — Other Ambulatory Visit: Payer: Self-pay | Admitting: Certified Nurse Midwife

## 2018-08-27 NOTE — Progress Notes (Signed)
58 y.o. [id number] Married  African American Fe here for annual exam. Menopausal no vaginal bleeding or vaginal dryness. Sees PCPK[facility]fisher) for Hypertension, cholesterol, and aex. All stable medications . Taking Vitamin D as instructed for deficiency. No weight gain. Had pimple on left nipple, that she opened with no redness or pain, no issues now. Mammogram current. Will be having knee surgery soon joint issues. No other health issues today.  No LMP recorded. Patient is postmenopausal.          Sexually active: No.  The current method of family planning is tubal ligation.    Exercising: No.  exercise Smoker:  no  Review of Systems  Constitutional: Negative.   HENT: Negative.   Eyes: Negative.   Respiratory: Negative.   Cardiovascular: Negative.   Gastrointestinal: Negative.   Genitourinary: Negative.   Musculoskeletal: Negative.   Skin: Negative.   Neurological: Negative.   Endo/Heme/Allergies: Negative.   Psychiatric/Behavioral: Negative.     Health Maintenance: Pap:  03-16-16 neg HPV HR neg History of Abnormal Pap: no MMG:  03-13-18 category b density birads 1:neg Self Breast exams: yes Colonoscopy:  2017 polyps f/u 60yrs BMD:   none TDaP:  Unsure  desires Shingles: no Pneumonia: no Hep C and HIV: not done Labs: PCP   reports that she has never smoked. She has never used smokeless tobacco. She reports that she does not drink alcohol or use drugs.  Past Medical History:  Diagnosis Date  . Asthma    as a child  . Hypertension   . Low vitamin D level   . Spinal stenosis     Past Surgical History:  Procedure Laterality Date  . CESAREAN SECTION    . DILATION AND CURETTAGE OF UTERUS    . OOPHORECTOMY     both  . TUBAL LIGATION      Current Outpatient Medications  Medication Sig Dispense Refill  . amLODipine (NORVASC) 10 MG tablet Take 1 tablet (10 mg total) by mouth daily. 90 tablet 3  . furosemide (LASIX) 80 MG tablet Take 80 mg by mouth 2 (two) times daily.     Marland Kitchen  losartan (COZAAR) 100 MG tablet Take 1 tablet by mouth daily.    . Nebivolol HCl 20 MG TABS Take 1 tablet by mouth daily.     Marland Kitchen spironolactone (ALDACTONE) 100 MG tablet Take 50 mg by mouth 2 (two) times daily.     . Vitamin D, Ergocalciferol, (DRISDOL) 1.25 MG (50000 UT) CAPS capsule Take 1 capsule (50,000 Units total) by mouth every 7 (seven) days. 16 capsule 0   No current facility-administered medications for this visit.     Family History  Problem Relation Age of Onset  . Hypertension Mother   . Diabetes Mother   . Ovarian cancer Maternal Grandmother   . Hypertension Maternal Grandmother   . Diabetes Maternal Grandmother   . Colon cancer Brother     ROS:  Pertinent items are noted in HPI.  Otherwise, a comprehensive ROS was negative.  Exam:   There were no vitals taken for this visit.   Ht Readings from Last 3 Encounters:  05/14/18 5\' 7"  (1.702 m)  04/19/18 5' 6.5" (1.689 m)  06/15/17 5' 6.5" (1.689 m)    General appearance: alert, cooperative and appears stated age Head: Normocephalic, without obvious abnormality, atraumatic Neck: no adenopathy, supple, symmetrical, trachea midline and thyroid normal to inspection and palpation Lungs: clear to auscultation bilaterally Breasts: normal appearance, no masses or tenderness, No nipple retraction  or dimpling, No nipple discharge or bleeding, No axillary or supraclavicular adenopathy, no lesions noted Heart: regular rate and rhythm Abdomen: soft, non-tender; no masses,  no organomegaly Extremities: extremities normal, atraumatic, no cyanosis or edema Skin: Skin color, texture, turgor normal. No rashes or lesions Lymph nodes: Cervical, supraclavicular, and axillary nodes normal. No abnormal inguinal nodes palpated Neurologic: Grossly normal   Pelvic: External genitalia:  no lesions              Urethra:  normal appearing urethra with no masses, tenderness or lesions              Bartholin's and Skene's: normal                  Vagina: normal appearing vagina with normal color and discharge, no lesions              Cervix: no cervical motion tenderness, no lesions and normal appearance              Pap taken: No. Bimanual Exam:  Uterus:  normal size, contour, position, consistency, mobility, non-tender and anteverted              Adnexa: normal adnexa and no mass, fullness, tenderness               Rectovaginal: Confirms               Anus:  normal sphincter tone, no lesions  Chaperone present: yes  A:  Well Woman with normal exam  Menopausal no HRT  Vitamin D deficiency  Hypertension, cholesterol management with PCP  TDAP due requests today    P:   Reviewed health and wellness pertinent to exam  Aware of need to advise if vaginal bleeding or dryness issues.  Lab: Vitamin D, will refill Rx if needed  Continue follow up with PCP as indicated  Will do TDAP  Pap smear: no   counseled on breast self exam, mammography screening, feminine hygiene, adequate intake of calcium and vitamin D, diet and exercise  return annually or prn  An After Visit Summary was printed and given to the patient.

## 2018-08-28 ENCOUNTER — Other Ambulatory Visit: Payer: Self-pay

## 2018-08-28 ENCOUNTER — Encounter: Payer: Self-pay | Admitting: Certified Nurse Midwife

## 2018-08-28 ENCOUNTER — Ambulatory Visit: Payer: 59 | Admitting: Certified Nurse Midwife

## 2018-08-28 VITALS — BP 124/72 | HR 68 | Temp 97.7°F | Resp 16 | Ht 66.25 in | Wt 277.0 lb

## 2018-08-28 DIAGNOSIS — Z01419 Encounter for gynecological examination (general) (routine) without abnormal findings: Secondary | ICD-10-CM

## 2018-08-28 DIAGNOSIS — Z23 Encounter for immunization: Secondary | ICD-10-CM

## 2018-08-28 DIAGNOSIS — N951 Menopausal and female climacteric states: Secondary | ICD-10-CM

## 2018-08-28 DIAGNOSIS — E663 Overweight: Secondary | ICD-10-CM

## 2018-08-28 DIAGNOSIS — E559 Vitamin D deficiency, unspecified: Secondary | ICD-10-CM

## 2018-08-29 ENCOUNTER — Other Ambulatory Visit: Payer: Self-pay | Admitting: Certified Nurse Midwife

## 2018-08-29 ENCOUNTER — Other Ambulatory Visit: Payer: Self-pay

## 2018-08-29 DIAGNOSIS — E559 Vitamin D deficiency, unspecified: Secondary | ICD-10-CM

## 2018-08-29 LAB — VITAMIN D 25 HYDROXY (VIT D DEFICIENCY, FRACTURES): Vit D, 25-Hydroxy: 22.2 ng/mL — ABNORMAL LOW (ref 30.0–100.0)

## 2018-08-29 MED ORDER — VITAMIN D (ERGOCALCIFEROL) 1.25 MG (50000 UNIT) PO CAPS: 50000.0000 [IU] | ORAL_CAPSULE | ORAL | 1 refills | Status: DC

## 2018-08-29 NOTE — Telephone Encounter (Signed)
Vitamin D is 22.2 from 9.6 10 months ago, so has improved, but not normal yet. Normal for menopause should be in 40-50 range  Continue on 50,000 Vit D Rx. Rx updated recheck in 6 months   Left message for callback. Rx & order pended.

## 2018-08-31 NOTE — Telephone Encounter (Signed)
Left message for call back.

## 2018-09-03 NOTE — Telephone Encounter (Signed)
Patient notified. rx was sent in & lab appt scheduled.

## 2018-11-18 ENCOUNTER — Other Ambulatory Visit: Payer: Self-pay | Admitting: Certified Nurse Midwife

## 2019-03-04 ENCOUNTER — Other Ambulatory Visit: Payer: 59

## 2019-03-04 ENCOUNTER — Other Ambulatory Visit (INDEPENDENT_AMBULATORY_CARE_PROVIDER_SITE_OTHER): Payer: 59

## 2019-03-04 DIAGNOSIS — E559 Vitamin D deficiency, unspecified: Secondary | ICD-10-CM

## 2019-03-05 ENCOUNTER — Other Ambulatory Visit: Payer: Self-pay | Admitting: Certified Nurse Midwife

## 2019-03-05 DIAGNOSIS — E559 Vitamin D deficiency, unspecified: Secondary | ICD-10-CM

## 2019-03-05 LAB — VITAMIN D 25 HYDROXY (VIT D DEFICIENCY, FRACTURES): Vit D, 25-Hydroxy: 20.4 ng/mL — ABNORMAL LOW (ref 30.0–100.0)

## 2019-03-05 MED ORDER — VITAMIN D (ERGOCALCIFEROL) 1.25 MG (50000 UNIT) PO CAPS: 50000.0000 [IU] | ORAL_CAPSULE | ORAL | 1 refills | Status: AC

## 2019-06-03 ENCOUNTER — Encounter: Payer: Self-pay | Admitting: Certified Nurse Midwife

## 2019-09-02 ENCOUNTER — Ambulatory Visit: Payer: 59 | Admitting: Certified Nurse Midwife

## 2020-04-14 ENCOUNTER — Other Ambulatory Visit: Payer: Self-pay | Admitting: Family Medicine

## 2020-04-14 DIAGNOSIS — Z1231 Encounter for screening mammogram for malignant neoplasm of breast: Secondary | ICD-10-CM

## 2020-05-29 ENCOUNTER — Other Ambulatory Visit: Payer: Self-pay

## 2020-05-29 ENCOUNTER — Ambulatory Visit
Admission: RE | Admit: 2020-05-29 | Discharge: 2020-05-29 | Disposition: A | Discharge status: Home / Self Care | Payer: 59 | Source: Ambulatory Visit | Attending: Family Medicine | Admitting: Family Medicine

## 2020-05-29 DIAGNOSIS — Z1231 Encounter for screening mammogram for malignant neoplasm of breast: Secondary | ICD-10-CM

## 2020-07-25 IMAGING — MG DIGITAL SCREENING BILATERAL MAMMOGRAM WITH TOMO AND CAD
6 of 10 series · 6 of 30 positions shown · non-contrast
Comparison: Previous exam(s).

CLINICAL DATA: Screening.

EXAM:
DIGITAL SCREENING BILATERAL MAMMOGRAM WITH TOMO AND CAD

[L CC synth-2D]
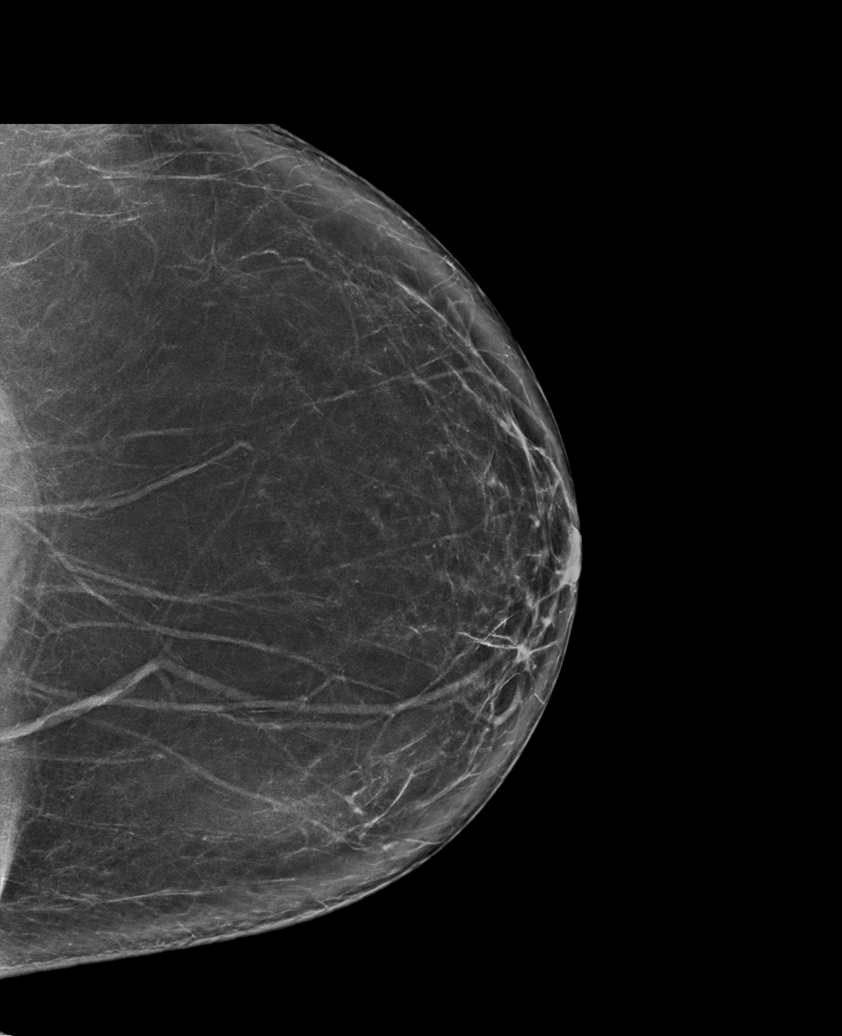

[L MLO synth-2D (1 of 2)]
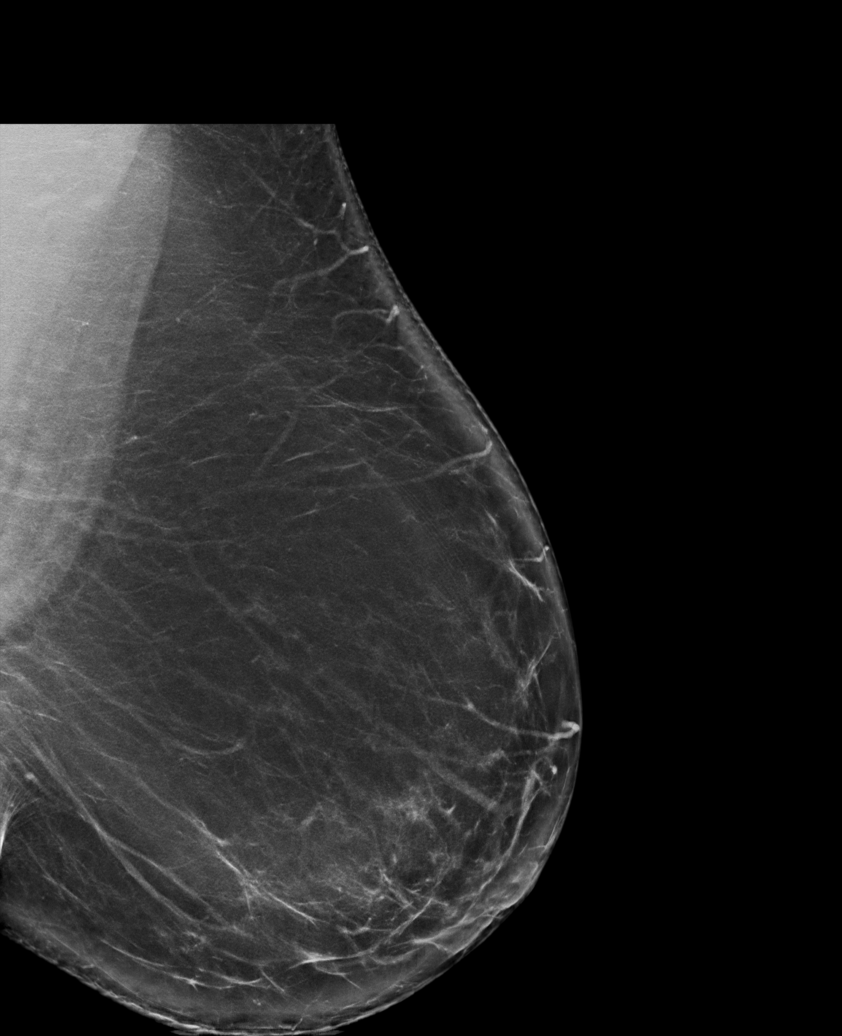

[R MLO synth-2D]
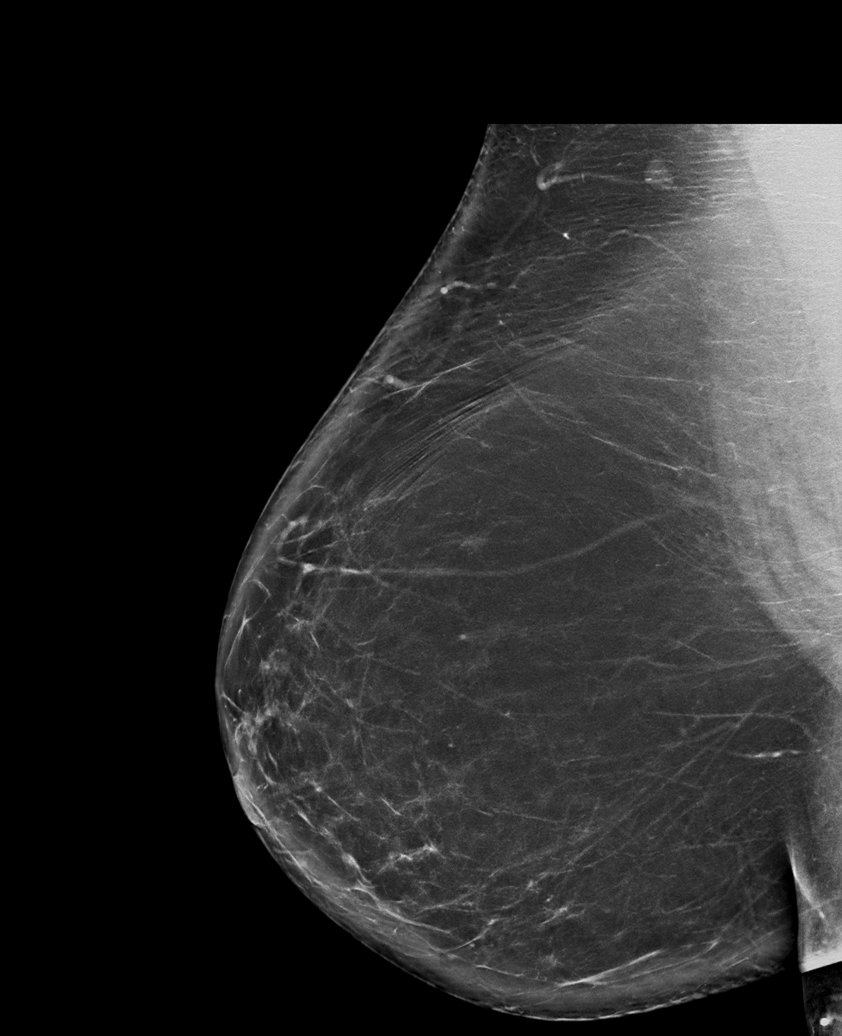

[R CC synth-2D]
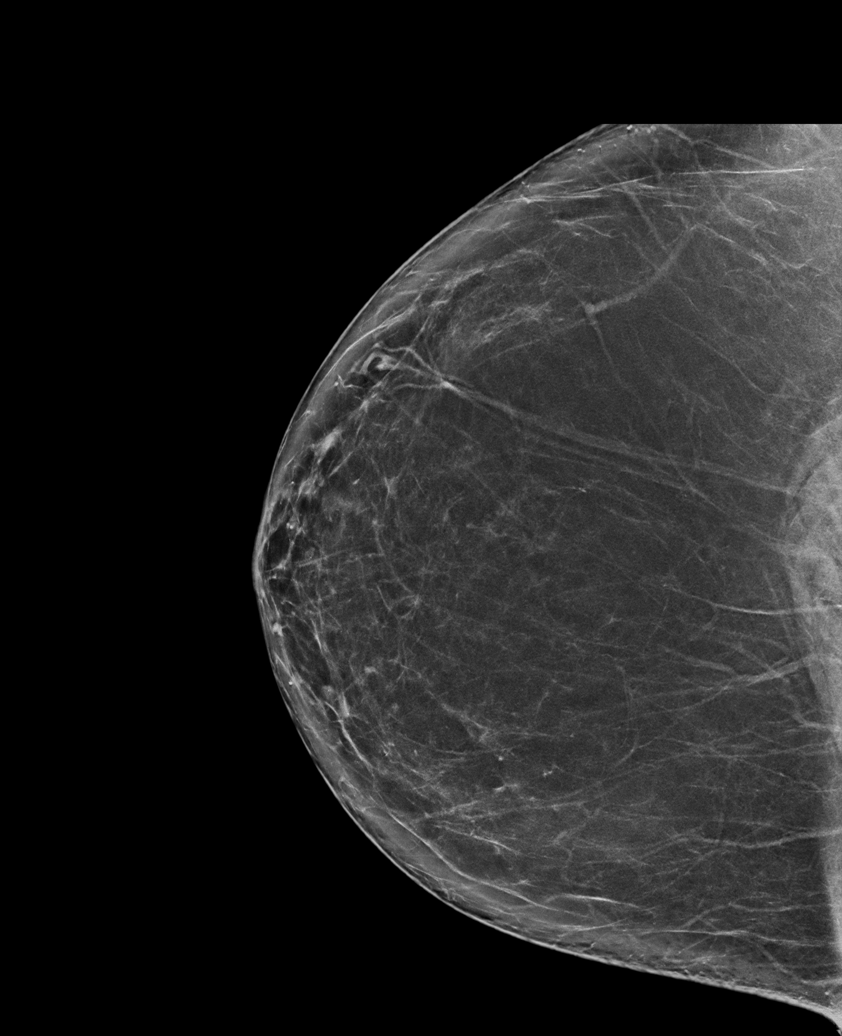

[L MLO synth-2D (2 of 2)]
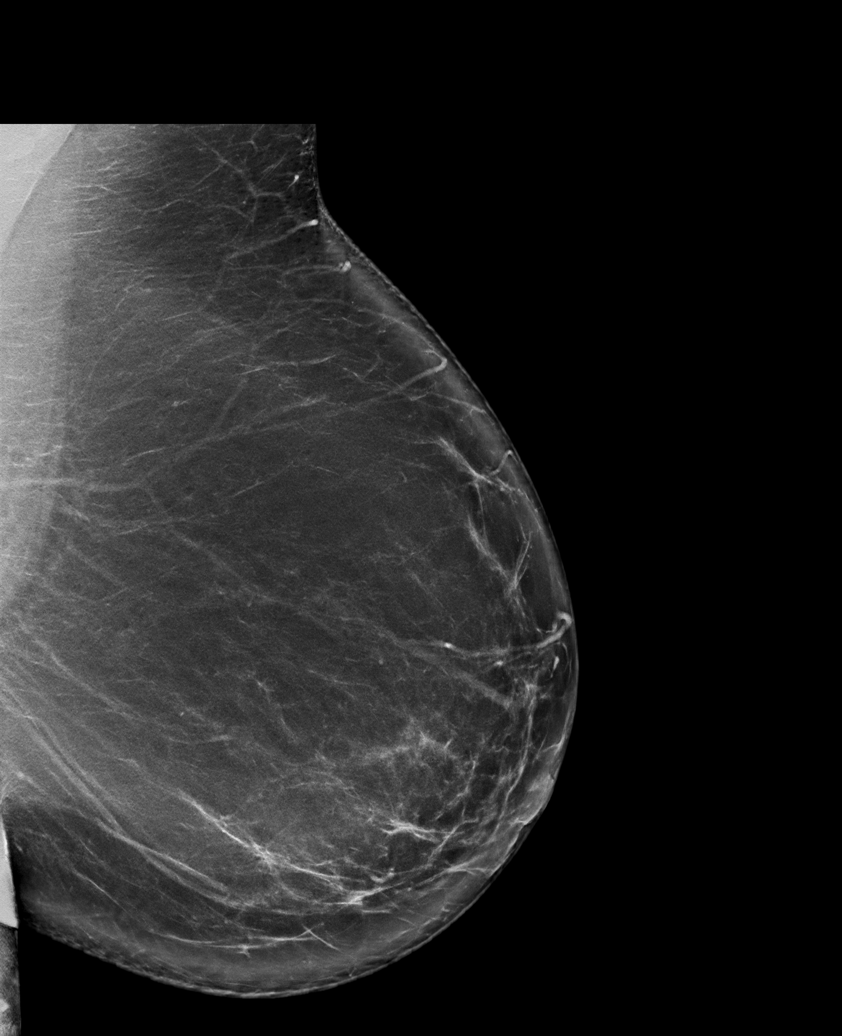

[L MLO tomo · tomo slice 53/105.0]
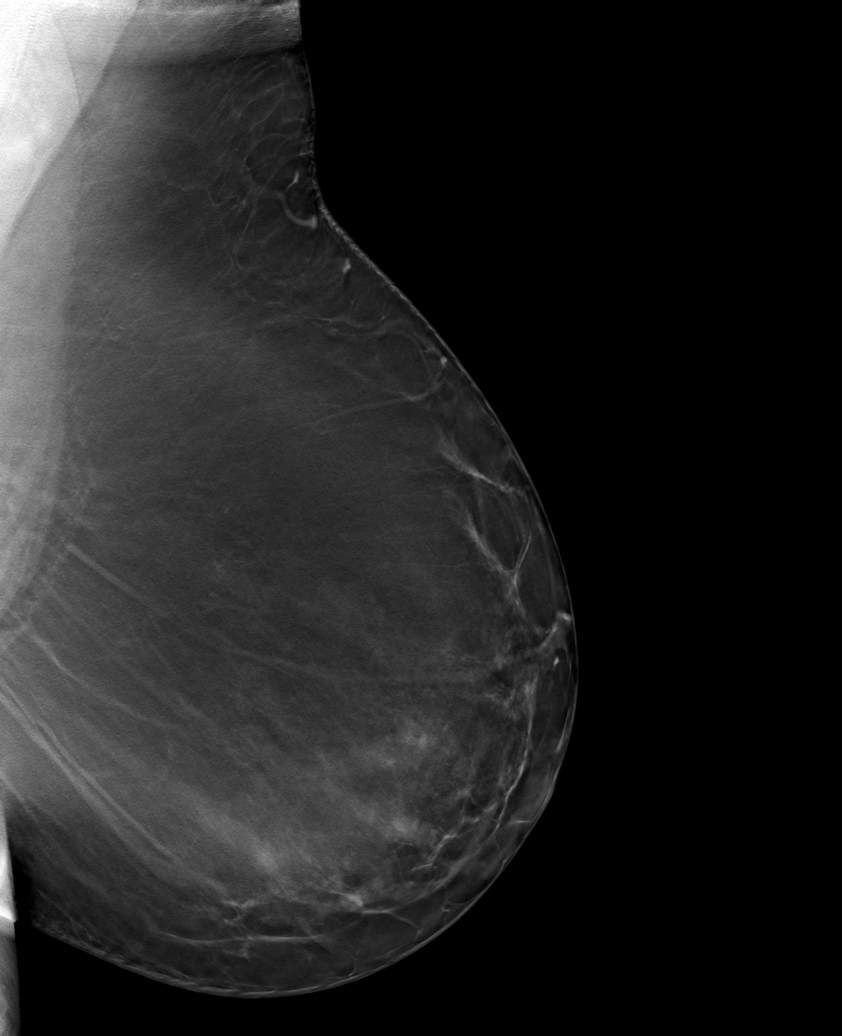

[6 of 30 positions shown; findings below may reference images not displayed]

ACR Breast Density Category b: There are scattered areas of
fibroglandular density.
FINDINGS: There are no findings suspicious for malignancy. Images were
processed with CAD.
IMPRESSION: No mammographic evidence of malignancy. A result letter of this
screening mammogram will be mailed directly to the patient.

RECOMMENDATION:
Screening mammogram in one year. (Code:[id number])

BI-RADS CATEGORY  1: Negative.

## 2021-07-28 ENCOUNTER — Other Ambulatory Visit (HOSPITAL_COMMUNITY): Payer: Self-pay | Admitting: Nephrology

## 2021-07-28 DIAGNOSIS — I701 Atherosclerosis of renal artery: Secondary | ICD-10-CM

## 2021-07-29 ENCOUNTER — Ambulatory Visit (HOSPITAL_COMMUNITY)
Admission: RE | Admit: 2021-07-29 | Discharge: 2021-07-29 | Disposition: A | Discharge status: Home / Self Care | Payer: 59 | Source: Ambulatory Visit | Attending: Nephrology | Admitting: Nephrology

## 2021-07-29 DIAGNOSIS — I701 Atherosclerosis of renal artery: Secondary | ICD-10-CM | POA: Insufficient documentation

## 2022-03-30 ENCOUNTER — Other Ambulatory Visit: Payer: Self-pay | Admitting: Family Medicine

## 2022-03-30 DIAGNOSIS — R1011 Right upper quadrant pain: Secondary | ICD-10-CM

## 2022-04-07 ENCOUNTER — Ambulatory Visit
Admission: RE | Admit: 2022-04-07 | Discharge: 2022-04-07 | Disposition: A | Discharge status: Home / Self Care | Payer: 59 | Source: Ambulatory Visit | Attending: Family Medicine | Admitting: Family Medicine

## 2022-04-07 DIAGNOSIS — R1011 Right upper quadrant pain: Secondary | ICD-10-CM

## 2022-04-07 MED ORDER — IOPAMIDOL (ISOVUE-300) INJECTION 61%
100.0000 mL | Freq: Once | INTRAVENOUS | Status: AC | PRN
  Administered 2022-04-07: 100 mL via INTRAVENOUS

## 2022-06-28 ENCOUNTER — Other Ambulatory Visit: Payer: Self-pay | Admitting: Nephrology

## 2022-06-28 DIAGNOSIS — I701 Atherosclerosis of renal artery: Secondary | ICD-10-CM

## 2022-08-01 ENCOUNTER — Ambulatory Visit
Admission: RE | Admit: 2022-08-01 | Discharge: 2022-08-01 | Disposition: A | Discharge status: Home / Self Care | Payer: 59 | Source: Ambulatory Visit | Attending: Nephrology | Admitting: Nephrology

## 2022-08-01 DIAGNOSIS — I701 Atherosclerosis of renal artery: Secondary | ICD-10-CM

## 2022-09-29 ENCOUNTER — Other Ambulatory Visit (HOSPITAL_COMMUNITY): Payer: Self-pay | Admitting: Family Medicine

## 2022-09-29 DIAGNOSIS — Z136 Encounter for screening for cardiovascular disorders: Secondary | ICD-10-CM

## 2022-10-11 ENCOUNTER — Ambulatory Visit (HOSPITAL_COMMUNITY)
Admission: RE | Admit: 2022-10-11 | Discharge: 2022-10-11 | Disposition: A | Discharge status: Home / Self Care | Payer: 59 | Source: Ambulatory Visit | Attending: Family Medicine | Admitting: Family Medicine

## 2022-10-11 DIAGNOSIS — Z136 Encounter for screening for cardiovascular disorders: Secondary | ICD-10-CM

## 2022-11-11 ENCOUNTER — Ambulatory Visit: Payer: 59 | Admitting: Cardiology

## 2022-11-11 ENCOUNTER — Encounter: Payer: Self-pay | Admitting: Cardiology

## 2022-11-11 VITALS — BP 135/81 | HR 81 | Resp 17 | Ht 66.0 in | Wt 283.0 lb

## 2022-11-11 DIAGNOSIS — I10 Essential (primary) hypertension: Secondary | ICD-10-CM

## 2022-11-11 DIAGNOSIS — Z789 Other specified health status: Secondary | ICD-10-CM

## 2022-11-11 DIAGNOSIS — E1165 Type 2 diabetes mellitus with hyperglycemia: Secondary | ICD-10-CM

## 2022-11-11 DIAGNOSIS — E7801 Familial hypercholesterolemia: Secondary | ICD-10-CM

## 2022-11-11 DIAGNOSIS — I2510 Atherosclerotic heart disease of native coronary artery without angina pectoris: Secondary | ICD-10-CM

## 2022-11-11 MED ORDER — ASPIRIN 81 MG PO TBEC: 81.0000 mg | DELAYED_RELEASE_TABLET | Freq: Every day | ORAL | 12 refills | Status: AC

## 2022-11-11 MED ORDER — REPATHA SURECLICK 140 MG/ML ~~LOC~~ SOAJ: 140.0000 mg | SUBCUTANEOUS | 2 refills | Status: DC

## 2022-11-11 NOTE — Progress Notes (Signed)
ID:  Alexandra Chang, DOB 17-Dec-1960, MRN [id number]  PCP:  Noberto Retort, MD  Cardiologist:  Tessa Lerner, DO, [facility] (established care 11/11/22)  REASON FOR CONSULT: Coronary artery calcification and statin intolerance  REQUESTING PHYSICIAN:  Noberto Retort, MD [address] [place],  [place] [address]  Chief Complaint  Patient presents with   elevated coronary artery calcium score   New Patient (Initial Visit)         HPI  Alexandra Chang is a 62 y.o. African-American female who presents to the clinic for evaluation of Coronary artery calcification and statin intolerance at the request of Noberto Retort, MD. 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  Patient is been a diabetic for at least 10 years prior to today's consultation and has noted elevated cholesterol levels in the past.  She is tried multiple statin medications including but not limited to atorvastatin, rosuvastatin, lovastatin and has been told that they all cause myalgias.  She is currently not on lipid-lowering agents.  She recently had a coronary calcium score which noted a total CAC of 29 placing her at the 80th percentile.  Given the fact that she is a diabetic she is referred to cardiology for lipid management.  Most recent lipid profile provided by PCP from July 2024 notes a LDL calculated 234 mg/dL.   Clinically denies anginal chest pain or heart failure symptoms.  No family history of premature coronary disease or sudden cardiac death or cardiomyopathy.  FUNCTIONAL STATUS: No structured exercise program or daily routine.  Limited due to osteoarthritis and orthopedic  conditions  CARDIAC DATABASE: EKG: 11/11/2022: Sinus rhythm, 70 bpm, without underlying ischemia injury pattern.  Echocardiogram: No results found for this or any previous visit from the past 1095 days.   CT Cardiac Scoring: 10/18/2022 Coronary Calcium Score:   Left main: 0  Left anterior descending artery: 29 Left circumflex artery: 0 Right coronary artery: 0   Total: 29 Percentile: 80   Pericardium: Normal.   Ascending Aorta: Normal caliber.   Mild mitral annular calcification.   Non-cardiac: Stable, benign 5 mm right lower lobe lung nodule.   Renal artery arterial duplex Aug 01, 2022: 1. Nonvisualization of the origin of the right main renal artery. However, no significant stenosis was seen at the origin on prior CT from 04/07/2022. No Doppler evidence of significant right renalartery stenosis.  2. Borderline elevated peak systolic velocity at the origin of the left main renal artery favored to be artifact given no significant narrowing seen at this site on prior CT from 04/07/2022. If there is continued high clinical suspicion for stenosis at this site, further evaluation with CTA or MRA can be performed for definitive evaluation. Otherwise no evidence of significant left renal artery stenosis.   ALLERGIES: Allergies  Allergen Reactions   Statins Other (See Comments)    Muscle aches   Lisinopril Other (See Comments)    Pt unsure of reaction: thinks it may make her cough and keep awake through the night    MEDICATION LIST PRIOR TO VISIT: Current Meds  Medication Sig   amLODipine (NORVASC) 10 MG tablet Take 1 tablet (10 mg total) by mouth daily.   aspirin EC 81 MG tablet Take  1 tablet (81 mg total) by mouth daily. Swallow whole.   Evolocumab (REPATHA SURECLICK) 140 MG/ML SOAJ Inject 140 mg into the skin every 14 (fourteen) days.   furosemide (LASIX) 40 MG tablet Take 40 mg by mouth daily.   hydrALAZINE (APRESOLINE) 25 MG tablet Take 0.5 tablets by mouth 3  (three) times daily.   losartan (COZAAR) 100 MG tablet Take 1 tablet by mouth daily.   Nebivolol HCl 20 MG TABS Take 1 tablet by mouth daily.    spironolactone (ALDACTONE) 100 MG tablet Take 50 mg by mouth 2 (two) times daily.    Vitamin D, Ergocalciferol, (DRISDOL) 1.25 MG (50000 UT) CAPS capsule Take 1 capsule (50,000 Units total) by mouth every 7 (seven) days.     PAST MEDICAL HISTORY: Past Medical History:  Diagnosis Date   Asthma    as a child   Hypertension    Low vitamin D level    Spinal stenosis     PAST SURGICAL HISTORY: Past Surgical History:  Procedure Laterality Date   CESAREAN SECTION     DILATION AND CURETTAGE OF UTERUS     OOPHORECTOMY     both   TUBAL LIGATION      FAMILY HISTORY: The patient family history includes Colon cancer in her brother; Diabetes in her maternal grandmother and mother; Hypertension in her maternal grandmother and mother; Ovarian cancer in her maternal grandmother.  SOCIAL HISTORY:  The patient  reports that she has never smoked. She has never used smokeless tobacco. She reports that she does not drink alcohol and does not use drugs.  REVIEW OF SYSTEMS: Review of Systems  Cardiovascular:  Negative for chest pain, claudication, dyspnea on exertion, irregular heartbeat, leg swelling, near-syncope, orthopnea, palpitations, paroxysmal nocturnal dyspnea and syncope.  Respiratory:  Negative for shortness of breath.   Hematologic/Lymphatic: Negative for bleeding problem.  Musculoskeletal:  Negative for muscle cramps and myalgias.  Neurological:  Negative for dizziness and light-headedness.    PHYSICAL EXAM:    11/11/2022    8:39 AM 08/28/2018    9:02 AM 05/14/2018    1:34 PM  Vitals with BMI  Height 5\' 6"  5' 6.25" 5\' 7"   Weight 283 lbs 277 lbs 274 lbs  BMI 45.7 44.36 42.9  Systolic 135 124   Diastolic 81 72   Pulse 81 68     Physical Exam  Constitutional: No distress.  Age appropriate, hemodynamically stable.   Neck: No JVD  present.  Cardiovascular: Normal rate, regular rhythm, S1 normal, S2 normal, intact distal pulses and normal pulses. Exam reveals no gallop, no S3 and no S4.  No murmur heard. Pulmonary/Chest: Effort normal and breath sounds normal. No stridor. She has no wheezes. She has no rales.  Abdominal: Soft. Bowel sounds are normal. She exhibits no distension. There is no abdominal tenderness.  Musculoskeletal:        General: No edema.     Cervical back: Neck supple.  Neurological: She is alert and oriented to person, place, and time. She has intact cranial nerves (2-12).  Skin: Skin is warm and moist.   LABORATORY DATA:    Latest Ref Rng & Units 01/06/2010    8:47 AM 01/06/2010    8:24 AM  CBC  WBC 4.0 - 10.5 K/uL  4.4   Hemoglobin 12.0 - 15.0 g/dL 95.6  21.3   Hematocrit 36.0 - 46.0 % 36.0  34.2   Platelets 150 - 400 K/uL  332        Latest  Ref Rng & Units 01/06/2010    8:47 AM 01/06/2010    8:24 AM  CMP  Glucose 70 - 99 mg/dL 130  865   BUN 6 - 23 mg/dL 20  16   Creatinine 0.4 - 1.2 mg/dL 1.1  7.84   Sodium 696 - 145 mEq/L 144  142   Potassium 3.5 - 5.1 mEq/L 3.4  3.3   Chloride 96 - 112 mEq/L 105  106   CO2 19 - 32 mEq/L  29   Calcium 8.4 - 10.5 mg/dL  8.7   Total Protein 6.0 - 8.3 g/dL  6.7   Total Bilirubin 0.3 - 1.2 mg/dL  0.3   Alkaline Phos 39 - 117 U/L  50   AST 0 - 37 U/L  28   ALT 0 - 35 U/L  19     Lab Results  Component Value Date   CHOL 311 (H) 10/24/2017   HDL 48 10/24/2017   LDLCALC 219 (H) 10/24/2017   TRIG 218 (H) 10/24/2017   CHOLHDL 6.5 (H) 10/24/2017   No components found for: "NTPROBNP" No results for input(s): "PROBNP" in the last 8760 hours. No results for input(s): "TSH" in the last 8760 hours.  BMP No results for input(s): "NA", "K", "CL", "CO2", "GLUCOSE", "BUN", "CREATININE", "CALCIUM", "GFRNONAA", "GFRAA" in the last 8760 hours.  HEMOGLOBIN A1C No results found for: "HGBA1C", "MPG"  External Labs: Collected: March 29, 2022 provided  by PCP BUN 26, creatinine 1.19. eGFR 52. Potassium 4.9. AST 15, ALT 21, alkaline phosphatase 62. A1c 6.6  Collected September 29, 2022 by PCP. Total cholesterol 320, triglycerides 184, HDL 49, LDL calculated 234, non-HDL 270 A1c 6.5  IMPRESSION:    ICD-10-CM   1. Coronary atherosclerosis due to calcified coronary lesion of native artery  I25.10 EKG 12-Lead   I25.84 Evolocumab (REPATHA SURECLICK) 140 MG/ML SOAJ    aspirin EC 81 MG tablet    PCV ECHOCARDIOGRAM COMPLETE    2. Essential hypertension  I10     3. Familial hypercholesterolemia  E78.01 Evolocumab (REPATHA SURECLICK) 140 MG/ML SOAJ    Lipid Panel With LDL/HDL Ratio    LDL cholesterol, direct    CMP14+EGFR    4. Statin intolerance  Z78.9 Evolocumab (REPATHA SURECLICK) 140 MG/ML SOAJ    5. Type 2 diabetes mellitus with hyperglycemia, without long-term current use of insulin (HCC)  E11.65 Evolocumab (REPATHA SURECLICK) 140 MG/ML SOAJ       RECOMMENDATIONS: Makynlee Quinde is a 62 y.o. 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 daily. Has been intolerant to statin therapy in the past.  Will start Repatha. Echo will be ordered to evaluate for structural heart disease and left ventricular systolic function. Reemphasized the importance of secondary prevention with focus on improving her modifiable cardiovascular risk factors such as glycemic control, lipid management, blood pressure control, weight loss. We did discuss the role of stress testing given the fact that she is looking for to have knee replacement in the future, underlying diabetes, familial  hypercholesterolemia, and CAC.  For now the shared decision was to increase physical activity with a goal of 30 minutes a day 5 days a week  Essential hypertension Consistent with resistant  hypertension. Has had renal duplex in the past. Follows with nephrology. Medications reconciled.  Familial hypercholesterolemia Statin intolerance Has had elevated cholesterol levels for many years. Has tried rosuvastatin, atorvastatin, lovastatin all of which have caused myalgias according to the patient. Currently not on any lipid-lowering agents. Her most recent LDL was 234 mg/dL no physical examination findings of lipid deposits. Start Repatha. Repeat labs in 6 weeks or 3 doses of Repatha.  Type 2 diabetes mellitus with hyperglycemia, without long-term current use of insulin (HCC) Reemphasized importance of glycemic control. Lipid management as discussed above. Consider changing Lasix to SGLT2 inhibitors given the cardiac and renal protection.  Data Reviewed: I have independently reviewed external notes provided by the referring provider as part of this office visit.   I have independently reviewed results of labs, EKGs, renal duplex as part of medical decision making. I have ordered the following tests:  Orders Placed This Encounter  Procedures   Lipid Panel With LDL/HDL Ratio    Standing Status:   Future    Standing Expiration Date:   11/11/2023   LDL cholesterol, direct    Standing Status:   Future    Standing Expiration Date:   11/11/2023   CMP14+EGFR    Standing Status:   Future    Standing Expiration Date:   11/11/2023   EKG 12-Lead   PCV ECHOCARDIOGRAM COMPLETE    Standing Status:   Future    Standing Expiration Date:   11/11/2023   I have made medications changes at today's encounter as noted above.  FINAL MEDICATION LIST END OF ENCOUNTER: Meds ordered this encounter  Medications   Evolocumab (REPATHA SURECLICK) 140 MG/ML SOAJ    Sig: Inject 140 mg into the skin every 14  (fourteen) days.    Dispense:  2 mL    Refill:  2   aspirin EC 81 MG tablet    Sig: Take 1 tablet (81 mg total) by mouth daily. Swallow whole.    Dispense:  30 tablet    Refill:  12    There are no discontinued medications.   Current Outpatient Medications:    amLODipine (NORVASC) 10 MG tablet, Take 1 tablet (10 mg total) by mouth daily., Disp: 90 tablet, Rfl: 3   aspirin EC 81 MG tablet, Take 1 tablet (81 mg total) by mouth daily. Swallow whole., Disp: 30 tablet, Rfl: 12   Evolocumab (REPATHA SURECLICK) 140 MG/ML SOAJ, Inject 140 mg into the skin every 14 (fourteen) days., Disp: 2 mL, Rfl: 2   furosemide (LASIX) 40 MG tablet, Take 40 mg by mouth daily., Disp: , Rfl:    hydrALAZINE (APRESOLINE) 25 MG tablet, Take 0.5 tablets by mouth 3 (three) times daily., Disp: , Rfl:    losartan (COZAAR) 100 MG tablet, Take 1 tablet by mouth daily., Disp: , Rfl:    Nebivolol HCl 20 MG TABS, Take 1 tablet by mouth daily. , Disp: , Rfl:    spironolactone (ALDACTONE) 100 MG tablet, Take 50 mg by mouth 2 (two) times daily. , Disp: , Rfl:    Vitamin D, Ergocalciferol, (DRISDOL) 1.25 MG (50000 UT) CAPS capsule, Take 1 capsule (50,000 Units total) by mouth every 7 (seven) days., Disp: 16 capsule, Rfl: 1  Orders Placed This Encounter  Procedures   Lipid Panel With LDL/HDL Ratio   LDL cholesterol, direct   AYT01+[id number]   EKG 12-Lead   PCV ECHOCARDIOGRAM COMPLETE    There are no Patient Instructions on file for this visit.   --Continue cardiac  medications as reconciled in final medication list. --Return in about 7 weeks (around 12/30/2022) for Follow up CAC & lipid management.  Labs prior. or sooner if needed. --Continue follow-up with your primary care physician regarding the management of your other chronic comorbid conditions.  Patient's questions and concerns were addressed to her satisfaction. She voices understanding of the instructions provided during this encounter.   This note was created using  a voice recognition software as a result there may be grammatical errors inadvertently enclosed that do not reflect the nature of this encounter. Every attempt is made to correct such errors.  Tessa Lerner, [place], [facility]  Pager:  [phone] Office: [phone]

## 2022-11-15 ENCOUNTER — Ambulatory Visit: Payer: 59

## 2022-11-15 DIAGNOSIS — I2510 Atherosclerotic heart disease of native coronary artery without angina pectoris: Secondary | ICD-10-CM

## 2022-11-21 ENCOUNTER — Encounter (HOSPITAL_COMMUNITY): Payer: Self-pay

## 2022-11-21 ENCOUNTER — Ambulatory Visit (HOSPITAL_COMMUNITY)
Admission: EM | Admit: 2022-11-21 | Discharge: 2022-11-21 | Disposition: A | Discharge status: Home / Self Care | Payer: 59 | Attending: Internal Medicine | Admitting: Internal Medicine

## 2022-11-21 ENCOUNTER — Other Ambulatory Visit: Payer: Self-pay | Admitting: Cardiology

## 2022-11-21 DIAGNOSIS — M5432 Sciatica, left side: Secondary | ICD-10-CM

## 2022-11-21 MED ORDER — METHYLPREDNISOLONE 4 MG PO TBPK: ORAL_TABLET | ORAL | 0 refills | Status: DC

## 2022-11-21 NOTE — Discharge Instructions (Addendum)
Start steroid dose pack. Avoid NSAIDs like ibuprofen or aleve, start ASA at end of steroid course. May use alternating ice and heat. If symptoms do not resolve or return quickly, you can come back to urgent care but highly schedule appointment with Spine MD.

## 2022-11-21 NOTE — ED Triage Notes (Signed)
Pt presents with complaints of back pain that started Saturday. Mostly on her left lower back down into her thigh. Pt initially thought is was from spinal stenosis but this feels different. Patient could not roll over in the bed yesterday. Pt has increasing pain when she coughs and takes a deep breath.

## 2022-11-21 NOTE — ED Provider Notes (Signed)
[facility]    CSN: [id number] Arrival date & time: 11/21/22  1346      History   Chief Complaint Chief Complaint  Patient presents with   Back Pain    HPI Alexandra Chang is a 62 y.o. female.   62 year old female who presents to urgent care with complaints of left-sided back pain that started Saturday evening.  She did go to the [employer] and walked some but not excessively.  The pain started around the lower back and progressed to her not being able to sit especially on her left buttocks.  By Sunday all she could do is lay down to find any comfort.  She denies any shooting pain down her legs.  She denies any loss of bowels or bladder.  She denies frequency, hematuria, dysuria or other urinary symptoms.  She did have an asymptomatic urinary tract infection 4 weeks ago that was treated by her PCP.  She has spinal stenosis but reports that this is much different than her normal spinal stenosis pain.  She follows with a spine surgeon on a regular basis at [facility].     Back Pain Associated symptoms: no abdominal pain, no chest pain, no dysuria and no fever     Past Medical History:  Diagnosis Date   Asthma    as a child   Hypertension    Low vitamin D level    Spinal stenosis     Patient Active Problem List   Diagnosis Date Noted   Pre-operative cardiovascular examination 04/19/2018   Morbid obesity (HCC) 04/19/2018   Increased body mass index 03/02/2018   Low back pain 03/02/2018   Spinal stenosis of lumbar region 03/02/2018   Pain in right knee 02/27/2018   Hyperlipidemia 02/24/2016   Essential hypertension 02/24/2016   NONSPECIFIC ABN FINDING RAD & OTH EXAM GI TRACT 12/31/2008    Past Surgical History:  Procedure Laterality Date   CESAREAN SECTION     DILATION AND CURETTAGE OF UTERUS     OOPHORECTOMY     both   TUBAL LIGATION      OB History     Gravida  3   Para      Term      Preterm      AB  1   Living  2      SAB  1   IAB       Ectopic      Multiple      Live Births               Home Medications    Prior to Admission medications   Medication Sig Start Date End Date Taking? Authorizing Provider  amLODipine (NORVASC) 10 MG tablet Take 1 tablet (10 mg total) by mouth daily. 02/24/16   Duke Salvia, MD  aspirin EC 81 MG tablet Take 1 tablet (81 mg total) by mouth daily. Swallow whole. 11/11/22   Tolia, Sunit, DO  Evolocumab (REPATHA SURECLICK) 140 MG/ML SOAJ Inject 140 mg into the skin every 14 (fourteen) days. 11/11/22   Tolia, Sunit, DO  furosemide (LASIX) 40 MG tablet Take 40 mg by mouth daily. 03/11/16   [provider]  hydrALAZINE (APRESOLINE) 25 MG tablet Take 0.5 tablets by mouth 3 (three) times daily. 04/08/20   [provider]  losartan (COZAAR) 100 MG tablet Take 1 tablet by mouth daily. 02/15/16   [provider]  Nebivolol HCl 20 MG TABS Take 1 tablet by mouth daily.  [provider]  spironolactone (ALDACTONE) 100 MG tablet Take 50 mg by mouth 2 (two) times daily.  02/15/16   [provider]  Vitamin D, Ergocalciferol, (DRISDOL) 1.25 MG (50000 UT) CAPS capsule Take 1 capsule (50,000 Units total) by mouth every 7 (seven) days. 03/05/19   Verner Chol, CNM    Family History Family History  Problem Relation Age of Onset   Hypertension Mother    Diabetes Mother    Colon cancer Brother    Ovarian cancer Maternal Grandmother    Hypertension Maternal Grandmother    Diabetes Maternal Grandmother     Social History Social History   Tobacco Use   Smoking status: Never   Smokeless tobacco: Never  Vaping Use   Vaping status: Never Used  Substance Use Topics   Alcohol use: No   Drug use: No     Allergies   Statins and Lisinopril   Review of Systems Review of Systems  Constitutional:  Negative for chills and fever.  HENT:  Negative for ear pain and sore throat.   Eyes:  Negative for pain and visual disturbance.  Respiratory:   Negative for cough and shortness of breath.   Cardiovascular:  Negative for chest pain and palpitations.  Gastrointestinal:  Negative for abdominal pain and vomiting.  Genitourinary:  Negative for decreased urine volume, dysuria and hematuria.       Pressure in lower abdomen with sudden drinks  Musculoskeletal:  Positive for back pain (Lower back left espeically). Negative for arthralgias.  Skin:  Negative for color change and rash.  Neurological:  Negative for seizures and syncope.  All other systems reviewed and are negative.    Physical Exam Triage Vital Signs ED Triage Vitals  Encounter Vitals Group     BP 11/21/22 1509 (!) 140/85     Systolic BP Percentile --      Diastolic BP Percentile --      Pulse Rate 11/21/22 1509 65     Resp 11/21/22 1509 18     Temp 11/21/22 1509 98.1 F (36.7 C)     Temp src --      SpO2 11/21/22 1509 93 %     Weight --      Height --      Head Circumference --      Peak Flow --      Pain Score 11/21/22 1508 8     Pain Loc --      Pain Education --      Exclude from Growth Chart --    No data found.  Updated Vital Signs BP (!) 140/85   Pulse 65   Temp 98.1 F (36.7 C)   Resp 18   SpO2 93%   Visual Acuity Right Eye Distance:   Left Eye Distance:   Bilateral Distance:    Right Eye Near:   Left Eye Near:    Bilateral Near:     Physical Exam Vitals and nursing note reviewed.  Constitutional:      General: She is not in acute distress.    Appearance: She is well-developed.  HENT:     Head: Normocephalic and atraumatic.  Eyes:     Conjunctiva/sclera: Conjunctivae normal.  Cardiovascular:     Rate and Rhythm: Normal rate and regular rhythm.     Heart sounds: No murmur heard. Pulmonary:     Effort: Pulmonary effort is normal. No respiratory distress.     Breath sounds: Normal breath sounds.  Abdominal:  Palpations: Abdomen is soft.     Tenderness: There is no abdominal tenderness.  Musculoskeletal:        General: No  swelling.     Cervical back: Neck supple.       Legs:  Skin:    General: Skin is warm and dry.     Capillary Refill: Capillary refill takes less than 2 seconds.  Neurological:     General: No focal deficit present.     Mental Status: She is alert and oriented to person, place, and time.     Cranial Nerves: No cranial nerve deficit.  Psychiatric:        Mood and Affect: Mood normal.      UC Treatments / Results  Labs (all labs ordered are listed, but only abnormal results are displayed) Labs Reviewed - No data to display  EKG   Radiology No results found.  Procedures Procedures (including critical care time)  Medications Ordered in UC Medications - No data to display  Initial Impression / Assessment and Plan / UC Course  I have reviewed the triage vital signs and the nursing notes.  Pertinent labs & imaging results that were available during my care of the patient were reviewed by me and considered in my medical decision making (see chart for details).     Sciatica of left side  Likely sciatica on the left side without radiculopathy.  Will start steroid dose pack. Avoid NSAIDs like ibuprofen or aleve, start ASA at end of steroid course. May use alternating ice and heat. If symptoms do not resolve or return quickly, patient can return to urgent care but highly recommend that she schedule appointment with Spine MD also.  Final Clinical Impressions(s) / UC Diagnoses   Final diagnoses:  None   Discharge Instructions   None    ED Prescriptions   None    PDMP not reviewed this encounter.   Landis Martins, [place] 11/21/22 1547

## 2022-11-22 LAB — CMP14+EGFR
ALT: 20 IU/L (ref 0–32)
AST: 25 IU/L (ref 0–40)
Albumin: 4.2 g/dL (ref 3.9–4.9)
Alkaline Phosphatase: 68 IU/L (ref 44–121)
BUN/Creatinine Ratio: 20 (ref 12–28)
BUN: 22 mg/dL (ref 8–27)
Bilirubin Total: 0.3 mg/dL (ref 0.0–1.2)
CO2: 25 mmol/L (ref 20–29)
Calcium: 10 mg/dL (ref 8.7–10.3)
Chloride: 103 mmol/L (ref 96–106)
Creatinine, Ser: 1.09 mg/dL — ABNORMAL HIGH (ref 0.57–1.00)
Globulin, Total: 2.6 g/dL (ref 1.5–4.5)
Glucose: 102 mg/dL — ABNORMAL HIGH (ref 70–99)
Potassium: 5.2 mmol/L (ref 3.5–5.2)
Sodium: 143 mmol/L (ref 134–144)
Total Protein: 6.8 g/dL (ref 6.0–8.5)
eGFR: 57 mL/min/{1.73_m2} — ABNORMAL LOW (ref >59)

## 2022-11-22 LAB — LIPID PANEL WITH LDL/HDL RATIO
Cholesterol, Total: 320 mg/dL — ABNORMAL HIGH (ref 100–199)
HDL: 49 mg/dL (ref >39)
LDL Chol Calc (NIH): 240 mg/dL — ABNORMAL HIGH (ref 0–99)
LDL/HDL Ratio: 4.9 ratio — ABNORMAL HIGH (ref 0.0–3.2)
Triglycerides: 161 mg/dL — ABNORMAL HIGH (ref 0–149)
VLDL Cholesterol Cal: 31 mg/dL (ref 5–40)

## 2022-11-22 LAB — LDL CHOLESTEROL, DIRECT: LDL Direct: 240 mg/dL — ABNORMAL HIGH (ref 0–99)

## 2022-11-23 NOTE — Progress Notes (Signed)
No answer. LVM ?

## 2022-11-24 ENCOUNTER — Other Ambulatory Visit: Payer: Self-pay

## 2022-11-24 DIAGNOSIS — Z789 Other specified health status: Secondary | ICD-10-CM

## 2022-11-24 DIAGNOSIS — I2510 Atherosclerotic heart disease of native coronary artery without angina pectoris: Secondary | ICD-10-CM

## 2022-11-24 DIAGNOSIS — E7801 Familial hypercholesterolemia: Secondary | ICD-10-CM

## 2022-11-24 DIAGNOSIS — E1165 Type 2 diabetes mellitus with hyperglycemia: Secondary | ICD-10-CM

## 2022-11-24 MED ORDER — REPATHA SURECLICK 140 MG/ML ~~LOC~~ SOAJ: 140.0000 mg | SUBCUTANEOUS | 2 refills | Status: DC

## 2022-11-24 NOTE — Progress Notes (Signed)
Patient has been made aware she voiced understanding

## 2022-11-24 NOTE — Progress Notes (Signed)
Pt has been made aware she voiced understanding

## 2022-12-13 ENCOUNTER — Telehealth: Payer: Self-pay | Admitting: Cardiology

## 2022-12-13 ENCOUNTER — Encounter: Payer: Self-pay | Admitting: Pharmacist

## 2022-12-13 ENCOUNTER — Other Ambulatory Visit: Payer: Self-pay | Admitting: Family Medicine

## 2022-12-13 ENCOUNTER — Telehealth: Payer: Self-pay | Admitting: Pharmacy Technician

## 2022-12-13 ENCOUNTER — Other Ambulatory Visit (HOSPITAL_COMMUNITY): Payer: Self-pay

## 2022-12-13 DIAGNOSIS — Z789 Other specified health status: Secondary | ICD-10-CM

## 2022-12-13 DIAGNOSIS — E7801 Familial hypercholesterolemia: Secondary | ICD-10-CM

## 2022-12-13 DIAGNOSIS — E1165 Type 2 diabetes mellitus with hyperglycemia: Secondary | ICD-10-CM

## 2022-12-13 DIAGNOSIS — I2510 Atherosclerotic heart disease of native coronary artery without angina pectoris: Secondary | ICD-10-CM

## 2022-12-13 DIAGNOSIS — Z1231 Encounter for screening mammogram for malignant neoplasm of breast: Secondary | ICD-10-CM

## 2022-12-13 MED ORDER — REPATHA SURECLICK 140 MG/ML ~~LOC~~ SOAJ: 140.0000 mg | SUBCUTANEOUS | 3 refills | Status: DC

## 2022-12-13 NOTE — Telephone Encounter (Signed)
Pharmacy Patient Advocate Encounter   Received notification from Pt Calls Messages that prior authorization for repatha is required/requested.   Insurance verification completed.   The patient is insured through [facility] .   Per test claim: PA required; PA submitted to [facility] via CoverMyMeds Key/confirmation #/EOC [id number] Status is pending

## 2022-12-13 NOTE — Telephone Encounter (Signed)
Pt c/o medication issue:  1. Name of Medication: Evolocumab (REPATHA SURECLICK) 140 MG/ML SOAJ   2. How are you currently taking this medication (dosage and times per day)?   3. Are you having a reaction (difficulty breathing--STAT)?   4. What is your medication issue? Patient called stating she needs a prior auth for this medication.  She states she has been trying to obtain this for the past three weeks now and still hasn't been able to get it.  She said to had stopped by the old office before [facility] closed and spoke to the nurse there and they told her they were going to get the prior auth started for her.

## 2022-12-13 NOTE — Telephone Encounter (Signed)
Pharmacy Patient Advocate Encounter  Received notification from [facility] that Prior Authorization for repatha has been APPROVED from 12/13/22 to 12/13/23. Ran test claim, Copay is $35.00. This test claim was processed through [facility]- copay amounts may vary at other pharmacies due to pharmacy/plan contracts, or as the patient moves through the different stages of their insurance plan.   PA #/Case ID/Reference #: [id number]

## 2022-12-13 NOTE — Telephone Encounter (Signed)
I don't see any documentation that staff at [facility] was working on PA for [employer]. Pt states she spoke with someone at their office in person. Unclear who this was as there was no documentation made.  Pt aware request has been forwarded to our prior auth team to assist. This will be a new start request - baseline LDL was 240 on 11/21/22.

## 2022-12-13 NOTE — Telephone Encounter (Signed)
Pt advised prior Berkley Harvey has been approved. Rx refilled. I have also activated $5 copay card and sent info to pt via mychart. She was appreciative for the assistance.

## 2022-12-16 MED ORDER — REPATHA SURECLICK 140 MG/ML ~~LOC~~ SOAJ: 140.0000 mg | SUBCUTANEOUS | 11 refills | Status: DC

## 2022-12-16 MED ORDER — REPATHA SURECLICK 140 MG/ML ~~LOC~~ SOAJ: 140.0000 mg | SUBCUTANEOUS | 3 refills | Status: DC

## 2022-12-16 NOTE — Addendum Note (Signed)
Addended by: Meili Kleckley E on: 12/16/2022 04:31 PM   Modules accepted: Orders

## 2022-12-27 ENCOUNTER — Ambulatory Visit: Payer: 59 | Admitting: Cardiology

## 2023-01-04 ENCOUNTER — Telehealth: Payer: Self-pay

## 2023-01-04 DIAGNOSIS — E7801 Familial hypercholesterolemia: Secondary | ICD-10-CM

## 2023-01-04 DIAGNOSIS — I2510 Atherosclerotic heart disease of native coronary artery without angina pectoris: Secondary | ICD-10-CM

## 2023-01-04 NOTE — Telephone Encounter (Signed)
Called pt to remind her that Dr. Odis Hollingshead wants her to repeat fasting lipids (lipid panel, direct LDL, and CMP) since she recently started PCSK9 (Repatha) and left voicemail for her to call back. Order for the fasting labs have been placed.

## 2023-01-04 NOTE — Telephone Encounter (Signed)
-----   Message from [facility] sent at 01/04/2023  1:02 PM EDT ----- Please remind the pt to have repeat fasting lipids since starting on PCSK9 inhibitor.   Sunit Holton, DO, [facility]

## 2023-01-10 ENCOUNTER — Ambulatory Visit
Admission: RE | Admit: 2023-01-10 | Discharge: 2023-01-10 | Disposition: A | Discharge status: Home / Self Care | Payer: 59 | Source: Ambulatory Visit | Attending: Family Medicine | Admitting: Family Medicine

## 2023-01-10 DIAGNOSIS — Z1231 Encounter for screening mammogram for malignant neoplasm of breast: Secondary | ICD-10-CM

## 2023-01-12 ENCOUNTER — Encounter: Payer: Self-pay | Admitting: Obstetrics and Gynecology

## 2023-01-12 ENCOUNTER — Other Ambulatory Visit (HOSPITAL_COMMUNITY)
Admission: RE | Admit: 2023-01-12 | Discharge: 2023-01-12 | Disposition: A | Discharge status: Home / Self Care | Payer: 59 | Source: Ambulatory Visit | Attending: Obstetrics and Gynecology | Admitting: Obstetrics and Gynecology

## 2023-01-12 ENCOUNTER — Ambulatory Visit: Payer: 59 | Admitting: Obstetrics and Gynecology

## 2023-01-12 VITALS — BP 128/68 | HR 74 | Ht 66.0 in | Wt 282.0 lb

## 2023-01-12 DIAGNOSIS — E559 Vitamin D deficiency, unspecified: Secondary | ICD-10-CM | POA: Insufficient documentation

## 2023-01-12 DIAGNOSIS — Z124 Encounter for screening for malignant neoplasm of cervix: Secondary | ICD-10-CM

## 2023-01-12 DIAGNOSIS — Z01419 Encounter for gynecological examination (general) (routine) without abnormal findings: Secondary | ICD-10-CM

## 2023-01-12 NOTE — Progress Notes (Signed)
62 y.o. [id number] postmenopausal female with obesity and HTN here for annual exam. Married. 4 grandchildren. Works full time at [employer].  Postmenopausal bleeding: none Pelvic discharge or pain: none Breast mass, nipple discharge or skin changes : none Last PAP: No results found for: "DIAGPAP", "HPVHIGH", "ADEQPAP" Last mammogram: 01/10/23 Bi-rads 1, density a Last colonoscopy: within last 3 yr, q36yr schedule Last DXA: never Sexually active: no  Exercising: active job but not exercising due to back and knee pain, seeing ortho  GYN HISTORY: No significant history.  OB History  Gravida Para Term Preterm AB Living  3       1 2   SAB IAB Ectopic Multiple Live Births  1       2    # Outcome Date GA Lbr Len/2nd Weight Sex Type Anes PTL Lv  3 Gravida     F CS-Unspec   LIV  2 Gravida     F CS-Unspec   LIV  1 SAB             Past Medical History:  Diagnosis Date   Asthma    as a child   Hypertension    Low vitamin D level    Spinal stenosis     Past Surgical History:  Procedure Laterality Date   CESAREAN SECTION     DILATION AND CURETTAGE OF UTERUS     OOPHORECTOMY     both   TUBAL LIGATION      Current Outpatient Medications on File Prior to Visit  Medication Sig Dispense Refill   amLODipine (NORVASC) 10 MG tablet Take 1 tablet (10 mg total) by mouth daily. 90 tablet 3   aspirin EC 81 MG tablet Take 1 tablet (81 mg total) by mouth daily. Swallow whole. 30 tablet 12   Evolocumab (REPATHA SURECLICK) 140 MG/ML SOAJ Inject 140 mg into the skin every 14 (fourteen) days. 2 mL 11   furosemide (LASIX) 40 MG tablet Take 40 mg by mouth daily.     hydrALAZINE (APRESOLINE) 25 MG tablet Take 0.5 tablets by mouth 3 (three) times daily.     losartan (COZAAR) 100 MG tablet Take 1 tablet by mouth daily.     Nebivolol HCl 20 MG TABS Take 1 tablet by mouth daily.      spironolactone (ALDACTONE) 100 MG tablet Take 50 mg by mouth 2 (two) times daily.      Vitamin D, Ergocalciferol,  (DRISDOL) 1.25 MG (50000 UT) CAPS capsule Take 1 capsule (50,000 Units total) by mouth every 7 (seven) days. 16 capsule 1   No current facility-administered medications on file prior to visit.    Social History   Socioeconomic History   Marital status: Married    Spouse name: Not on file   Number of children: 2   Years of education: Not on file   Highest education level: Not on file  Occupational History   Not on file  Tobacco Use   Smoking status: Never   Smokeless tobacco: Never  Vaping Use   Vaping status: Never Used  Substance and Sexual Activity   Alcohol use: No   Drug use: No   Sexual activity: Not Currently    Birth control/protection: Post-menopausal, Surgical    Comment: BTL  Other Topics Concern   Not on file  Social History Narrative   Not on file   Social Determinants of Health   Financial Resource Strain: Not on file  Food Insecurity: Not on file  Transportation Needs:  Not on file  Physical Activity: Not on file  Stress: Not on file  Social Connections: Not on file  Intimate Partner Violence: Not on file    Family History  Problem Relation Age of Onset   Hypertension Mother    Diabetes Mother    Colon cancer Brother    Ovarian cancer Maternal Grandmother    Hypertension Maternal Grandmother    Diabetes Maternal Grandmother    Breast cancer Neg Hx     Allergies  Allergen Reactions   Statins Other (See Comments)    Muscle aches   Lisinopril Other (See Comments)    Pt unsure of reaction: thinks it may make her cough and keep awake through the night      PE Today's Vitals   01/12/23 1344  BP: 128/68  Pulse: 74  SpO2: 100%  Weight: 282 lb (127.9 kg)  Height: 5\' 6"  (1.676 m)   Body mass index is 45.52 kg/m.  Physical Exam Vitals reviewed. Exam conducted with a chaperone present.  Constitutional:      General: She is not in acute distress.    Appearance: Normal appearance.  HENT:     Head: Normocephalic and atraumatic.     Nose:  Nose normal.  Eyes:     Extraocular Movements: Extraocular movements intact.     Conjunctiva/sclera: Conjunctivae normal.  Neck:     Thyroid: No thyroid mass, thyromegaly or thyroid tenderness.  Pulmonary:     Effort: Pulmonary effort is normal.  Chest:     Chest wall: No mass or tenderness.  Breasts:    Right: Normal. No swelling, mass, nipple discharge, skin change or tenderness.     Left: Normal. No swelling, mass, nipple discharge, skin change or tenderness.  Abdominal:     General: There is no distension.     Palpations: Abdomen is soft.     Tenderness: There is no abdominal tenderness.  Genitourinary:    General: Normal vulva.     Exam position: Lithotomy position.     Urethra: No prolapse.     Vagina: Normal. No vaginal discharge or bleeding.     Cervix: Normal. No lesion.     Uterus: Normal. Not enlarged and not tender.      Adnexa: Right adnexa normal and left adnexa normal.  Musculoskeletal:        General: Normal range of motion.     Cervical back: Normal range of motion.  Lymphadenopathy:     Upper Body:     Right upper body: No axillary adenopathy.     Left upper body: No axillary adenopathy.     Lower Body: No right inguinal adenopathy. No left inguinal adenopathy.  Skin:    General: Skin is warm and dry.  Neurological:     General: No focal deficit present.     Mental Status: She is alert.  Psychiatric:        Mood and Affect: Mood normal.        Behavior: Behavior normal.       Assessment and Plan:        Cervical cancer screening -     Cytology - PAP  Well woman exam with routine gynecological exam Assessment & Plan: Cervical cancer screening performed according to ASCCP guidelines. Encouraged annual mammogram screening Colonoscopy UTD DXA plan for next year given hx of vit D deficiency Labs and immunizations with her primary Encouraged safe sexual practices as indicated Encouraged healthy lifestyle practices with diet and exercise For  patients under 50-70yo, I recommend 1200mg  calcium daily and 600IU of vitamin D daily.      Rosalyn Gess, MD

## 2023-01-12 NOTE — Assessment & Plan Note (Addendum)
Cervical cancer screening performed according to ASCCP guidelines. Encouraged annual mammogram screening Colonoscopy UTD DXA plan for next year given hx of vit D deficiency Labs and immunizations with her primary Encouraged safe sexual practices as indicated Encouraged healthy lifestyle practices with diet and exercise For patients under 50-62yo, I recommend 1200mg  calcium daily and 600IU of vitamin D daily.

## 2023-01-12 NOTE — Patient Instructions (Signed)

## 2023-01-19 LAB — CYTOLOGY - PAP
Adequacy: ABSENT
Comment: NEGATIVE
Diagnosis: UNDETERMINED — AB
High risk HPV: NEGATIVE

## 2023-01-23 ENCOUNTER — Encounter: Payer: Self-pay | Admitting: Obstetrics and Gynecology

## 2023-01-23 DIAGNOSIS — R87610 Atypical squamous cells of undetermined significance on cytologic smear of cervix (ASC-US): Secondary | ICD-10-CM | POA: Insufficient documentation

## 2023-01-30 LAB — LIPID PANEL
Chol/HDL Ratio: 3.7 ratio (ref 0.0–4.4)
Cholesterol, Total: 192 mg/dL (ref 100–199)
HDL: 52 mg/dL (ref >39)
LDL Chol Calc (NIH): 117 mg/dL — ABNORMAL HIGH (ref 0–99)
Triglycerides: 129 mg/dL (ref 0–149)
VLDL Cholesterol Cal: 23 mg/dL (ref 5–40)

## 2023-01-30 LAB — COMPREHENSIVE METABOLIC PANEL
ALT: 19 [IU]/L (ref 0–32)
AST: 22 [IU]/L (ref 0–40)
Albumin: 4.3 g/dL (ref 3.9–4.9)
Alkaline Phosphatase: 66 [IU]/L (ref 44–121)
BUN/Creatinine Ratio: 21 (ref 12–28)
BUN: 23 mg/dL (ref 8–27)
Bilirubin Total: 0.2 mg/dL (ref 0.0–1.2)
CO2: 26 mmol/L (ref 20–29)
Calcium: 9.7 mg/dL (ref 8.7–10.3)
Chloride: 102 mmol/L (ref 96–106)
Creatinine, Ser: 1.1 mg/dL — ABNORMAL HIGH (ref 0.57–1.00)
Globulin, Total: 2.2 g/dL (ref 1.5–4.5)
Glucose: 104 mg/dL — ABNORMAL HIGH (ref 70–99)
Potassium: 4.8 mmol/L (ref 3.5–5.2)
Sodium: 142 mmol/L (ref 134–144)
Total Protein: 6.5 g/dL (ref 6.0–8.5)
eGFR: 57 mL/min/{1.73_m2} — ABNORMAL LOW (ref >59)

## 2023-01-30 LAB — LDL CHOLESTEROL, DIRECT: LDL Direct: 123 mg/dL — ABNORMAL HIGH (ref 0–99)

## 2023-01-31 ENCOUNTER — Ambulatory Visit: Payer: 59 | Attending: Cardiology | Admitting: Cardiology

## 2023-01-31 ENCOUNTER — Encounter: Payer: Self-pay | Admitting: Cardiology

## 2023-01-31 VITALS — BP 118/76 | HR 75 | Resp 16 | Ht 66.0 in | Wt 282.4 lb

## 2023-01-31 DIAGNOSIS — E7801 Familial hypercholesterolemia: Secondary | ICD-10-CM | POA: Diagnosis not present

## 2023-01-31 DIAGNOSIS — Z789 Other specified health status: Secondary | ICD-10-CM | POA: Diagnosis not present

## 2023-01-31 DIAGNOSIS — I2584 Coronary atherosclerosis due to calcified coronary lesion: Secondary | ICD-10-CM

## 2023-01-31 DIAGNOSIS — E1159 Type 2 diabetes mellitus with other circulatory complications: Secondary | ICD-10-CM | POA: Diagnosis not present

## 2023-01-31 DIAGNOSIS — I701 Atherosclerosis of renal artery: Secondary | ICD-10-CM

## 2023-01-31 DIAGNOSIS — I2510 Atherosclerotic heart disease of native coronary artery without angina pectoris: Secondary | ICD-10-CM

## 2023-01-31 DIAGNOSIS — I10 Essential (primary) hypertension: Secondary | ICD-10-CM

## 2023-01-31 NOTE — Progress Notes (Signed)
Cardiology Office Note:  .   Date:  01/31/2023  ID:  Alexandra Chang, DOB Oct 10, 1960, MRN [id number] PCP:  Noberto Retort, MD  Former Cardiology Providers: NA  HeartCare Providers Cardiologist:  Tessa Lerner, DO , [facility] (established care August 2024) Electrophysiologist:  None  Click to update primary MD,subspecialty MD or APP then REFRESH:1}    Chief Complaint  Patient presents with   Coronary atherosclerosis due to calcified coronary lesion o   Hyperlipidemia   Follow-up    History of Present Illness: .   Alexandra Chang is a 62 y.o. African-American female whose past medical history and cardiovascular risk factors includes:  Hypertension with CKD, statin intolerance, pure hypercholesterolemia likely familial, statin intolerance, vitamin D deficiency, diabetes mellitus type 2, bilateral carpal carpal tunnel, obesity.  Patient was referred to the practice for evaluation and management of coronary artery calcification and cholesterol.  Patient is been a diabetic for at least 10 years prior to establishing care and she has been on multiple statin medications including atorvastatin, rosuvastatin, lovastatin but they were causing her myalgias and therefore chose not to be on any lipid-lowering agents.  Her LDL was 234 mg/dL as of July 4098.  At the last office visit she was started on Repatha and the most recent labs from January 30, 2023 independently reviewed which notes an LDL level of 123 mg/dL.  She presents today for follow-up.  She is tolerating Repatha well without any side effects or intolerances.  She is very encouraged knowing her recent lipid levels.   Review of Systems: .   Review of Systems  Cardiovascular:  Negative for chest pain, claudication, irregular heartbeat, leg swelling, near-syncope, orthopnea, palpitations, paroxysmal nocturnal dyspnea and syncope.  Respiratory:  Negative for shortness of breath.   Hematologic/Lymphatic: Negative for bleeding problem.     Studies Reviewed:   EKG: 11/11/2022: Sinus rhythm, 70 bpm, without underlying ischemia injury pattern.  Echocardiogram: September 2024: LVEF 50%, normal diastolic function. No significant valvular heart disease, see report for additional details  CT Cardiac Scoring: 10/18/2022 Coronary Calcium Score:   Left main: 0  Left anterior descending artery: 29 Left circumflex artery: 0 Right coronary artery: 0   Total: 29 Percentile: 80  RADIOLOGY: N/A  Risk Assessment/Calculations:   N/A   Labs:       Latest Ref Rng & Units 01/06/2010    8:47 AM 01/06/2010    8:24 AM  CBC  WBC 4.0 - 10.5 K/uL  4.4   Hemoglobin 12.0 - 15.0 g/dL 11.9  14.7   Hematocrit 36.0 - 46.0 % 36.0  34.2   Platelets 150 - 400 K/uL  332        Latest Ref Rng & Units 01/30/2023   10:43 AM 11/21/2022   12:46 PM 01/06/2010    8:47 AM  BMP  Glucose 70 - 99 mg/dL 829  562  130   BUN 8 - 27 mg/dL 23  22  20    Creatinine 0.57 - 1.00 mg/dL 8.65  7.84  1.1   BUN/Creat Ratio 12 - 28 21  20     Sodium 134 - 144 mmol/L 142  143  144   Potassium 3.5 - 5.2 mmol/L 4.8  5.2  3.4   Chloride 96 - 106 mmol/L 102  103  105   CO2 20 - 29 mmol/L 26  25    Calcium 8.7 - 10.3 mg/dL 9.7  69.6        Latest Ref Rng & Units 01/30/2023  10:43 AM 11/21/2022   12:46 PM 01/06/2010    8:47 AM  CMP  Glucose 70 - 99 mg/dL 409  811  914   BUN 8 - 27 mg/dL 23  22  20    Creatinine 0.57 - 1.00 mg/dL 7.82  9.56  1.1   Sodium 134 - 144 mmol/L 142  143  144   Potassium 3.5 - 5.2 mmol/L 4.8  5.2  3.4   Chloride 96 - 106 mmol/L 102  103  105   CO2 20 - 29 mmol/L 26  25    Calcium 8.7 - 10.3 mg/dL 9.7  21.3    Total Protein 6.0 - 8.5 g/dL 6.5  6.8    Total Bilirubin 0.0 - 1.2 mg/dL 0.2  0.3    Alkaline Phos 44 - 121 IU/L 66  68    AST 0 - 40 IU/L 22  25    ALT 0 - 32 IU/L 19  20      Lab Results  Component Value Date   CHOL 192 01/30/2023   HDL 52 01/30/2023   LDLCALC 117 (H) 01/30/2023   LDLDIRECT 123 (H)  01/30/2023   TRIG 129 01/30/2023   CHOLHDL 3.7 01/30/2023   No results for input(s): "LIPOA" in the last 8760 hours. No components found for: "NTPROBNP" No results for input(s): "PROBNP" in the last 8760 hours. No results for input(s): "TSH" in the last 8760 hours.  Collected September 29, 2022 by PCP. Total cholesterol 320, triglycerides 184, HDL 49, LDL calculated 234, non-HDL 270   Physical Exam:    Today's Vitals   01/31/23 1246  BP: 118/76  Pulse: 75  Resp: 16  SpO2: 97%  Weight: 282 lb 6.4 oz (128.1 kg)  Height: 5\' 6"  (1.676 m)   Body mass index is 45.58 kg/m. Wt Readings from Last 3 Encounters:  01/31/23 282 lb 6.4 oz (128.1 kg)  01/12/23 282 lb (127.9 kg)  11/11/22 283 lb (128.4 kg)    Physical Exam  Constitutional: No distress.  Age appropriate, hemodynamically stable.   Neck: No JVD present.  Cardiovascular: Normal rate, regular rhythm, S1 normal, S2 normal, intact distal pulses and normal pulses. Exam reveals no gallop, no S3 and no S4.  No murmur heard. Pulmonary/Chest: Effort normal and breath sounds normal. No stridor. She has no wheezes. She has no rales.  Abdominal: Soft. Bowel sounds are normal. She exhibits no distension. There is no abdominal tenderness.  Musculoskeletal:        General: No edema.     Cervical back: Neck supple.  Neurological: She is alert and oriented to person, place, and time. She has intact cranial nerves (2-12).  Skin: Skin is warm and moist.     Impression & Recommendation(s):  Impression:   ICD-10-CM   1. Familial hypercholesterolemia  E78.01     2. Statin intolerance  Z78.9     3. Coronary atherosclerosis due to calcified coronary lesion of native artery  I25.10    I25.84     4. Type 2 diabetes mellitus with other circulatory complication, without long-term current use of insulin (HCC)  E11.59     5. Essential hypertension  I10     6. 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 November 2024 LDL 123 mg/dL. Has tolerated initiation of  Repatha well without any significant intolerances or side effects. Given her diabetes, atherosclerotic burden, coronary calcification recommended goal LDL at least <70 mg/dL and if possible closer to 55 mg/dL. We discussed initiation of Nexlizet to help further augment her lipid profile. Patient states that she would like to continue her lifestyle changes and would like to be considered for Ozempic to help facilitate weight loss and glycemic control.  Type 2 diabetes mellitus with other circulatory complication, without long-term current use of insulin (HCC) Currently not on oral antiglycemic agents. Being monitored by dietary changes, per patient. Consider transitioning Lasix to [facility] or other SGLT2 inhibitors for cardiac and renal protection-will defer to PCP. Patient will talk to PCP with regards to initiation of Ozempic and if she has difficulty we will reach back out to [place] for further guidance.  Essential hypertension Office blood pressures are very well-controlled. Continue current medical therapy.  Since last office visit patient has increased her physical activity but no structured exercise program or daily routine.  We discussed the role of stress testing to further risk stratify her given her CAC and multiple risk factors; however, patient would like testing at this time and monitor herself clinically.  Orders Placed:  No orders of the defined types were placed in this encounter.   Final Medication List:   No orders of the defined types were placed in this encounter.   There are no discontinued medications.   Current Outpatient Medications:    amLODipine (NORVASC) 10 MG tablet, Take 1 tablet (10 mg total) by mouth daily., Disp:  90 tablet, Rfl: 3   aspirin EC 81 MG tablet, Take 1 tablet (81 mg total) by mouth daily. Swallow whole., Disp: 30 tablet, Rfl: 12   Evolocumab (REPATHA SURECLICK) 140 MG/ML SOAJ, Inject 140 mg into the skin every 14 (fourteen) days., Disp: 2 mL, Rfl: 11   furosemide (LASIX) 40 MG tablet, Take 40 mg by mouth daily., Disp: , Rfl:    hydrALAZINE (APRESOLINE) 25 MG tablet, Take 0.5 tablets by mouth 3 (three) times daily., Disp: , Rfl:    losartan (COZAAR) 100 MG tablet, Take 1 tablet by mouth daily., Disp: , Rfl:    Nebivolol HCl 20 MG TABS, Take 1 tablet by mouth daily. , Disp: , Rfl:    spironolactone (ALDACTONE) 100 MG tablet, Take 50 mg by mouth 2 (two) times daily. , Disp: , Rfl:    Vitamin D, Ergocalciferol, (DRISDOL) 1.25 MG (50000 UT) CAPS capsule, Take 1 capsule (50,000 Units total) by mouth every 7 (seven) days., Disp: 16 capsule, Rfl: 1  Consent:   N/A  Disposition:   6 months follow-up sooner if needed  Her questions and concerns were addressed to her satisfaction. She voices understanding of the recommendations provided during this encounter.    Signed, Tessa Lerner, DO, [facility] [place]  [facility]  [address] [place], [place] [address] 01/31/2023 1:23 PM

## 2023-01-31 NOTE — Patient Instructions (Signed)
 Medication Instructions:  Your physician recommends that you continue on your current medications as directed. Please refer to the Current Medication list given to you today.  *If you need a refill on your cardiac medications before your next appointment, please call your pharmacy*  Lab Work: None ordered today. If you have labs (blood work) drawn today and your tests are completely normal, you will receive your results only by: MyChart Message (if you have MyChart) OR A paper copy in the mail If you have any lab test that is abnormal or we need to change your treatment, we will call you to review the results.  Testing/Procedures: None ordered today.  Follow-Up: At [facility], you and your health needs are our priority.  As part of our continuing mission to provide you with exceptional heart care, we have created designated Provider Care Teams.  These Care Teams include your primary Cardiologist (physician) and Advanced Practice Providers (APPs -  Physician Assistants and Nurse Practitioners) who all work together to provide you with the care you need, when you need it.  Your next appointment:   6 month(s)  The format for your next appointment:   In Person  Provider:   Tessa Lerner, DO {

## 2023-11-14 ENCOUNTER — Telehealth: Payer: Self-pay | Admitting: Pharmacy Technician

## 2023-11-14 ENCOUNTER — Other Ambulatory Visit (HOSPITAL_COMMUNITY): Payer: Self-pay

## 2023-11-14 NOTE — Telephone Encounter (Signed)
   Pharmacy Patient Advocate Encounter   Received notification from Onbase that prior authorization for REPATHA  is required/requested.   Insurance verification completed.   The patient is insured through [facility] .   Per test claim: PA required; PA submitted to above mentioned insurance via Latent Key/confirmation #/EOC [id number] Status is pending

## 2023-12-01 LAB — HM COLONOSCOPY

## 2023-12-08 ENCOUNTER — Ambulatory Visit: Payer: Self-pay | Admitting: Obstetrics and Gynecology

## 2023-12-25 ENCOUNTER — Other Ambulatory Visit: Payer: Self-pay | Admitting: Pharmacist

## 2023-12-25 DIAGNOSIS — E78019 Familial hypercholesterolemia, unspecified: Secondary | ICD-10-CM

## 2023-12-25 DIAGNOSIS — E1165 Type 2 diabetes mellitus with hyperglycemia: Secondary | ICD-10-CM

## 2023-12-25 DIAGNOSIS — I2510 Atherosclerotic heart disease of native coronary artery without angina pectoris: Secondary | ICD-10-CM

## 2023-12-25 DIAGNOSIS — Z789 Other specified health status: Secondary | ICD-10-CM

## 2023-12-25 MED ORDER — REPATHA SURECLICK 140 MG/ML ~~LOC~~ SOAJ: 140.0000 mg | SUBCUTANEOUS | 0 refills | Status: AC

## 2024-01-11 NOTE — Progress Notes (Signed)
 63 y.o. [id number] postmenopausal female with spinal stenosis here for annual exam. Married.  4 grandchildren, oldest is 18yo girl studying at [employer].  Works at [employer]. PCP: Arloa Elsie SAUNDERS, MD   No LMP recorded. Patient is postmenopausal.   She reports no other concerns.  Planning to start Mounjaro for weight loss. Urine sample provided: No  Abnormal bleeding: none Pelvic discharge or pain: none Breast mass, nipple discharge or skin changes : none  Sexually active: Not currently Birth control: BTL Last PAP:     Component Value Date/Time   DIAGPAP (A) 01/12/2023 1428    - Atypical squamous cells of undetermined significance (ASC-US )   HPVHIGH Negative 01/12/2023 1428   ADEQPAP  01/12/2023 1428    Satisfactory for evaluation; transformation zone component ABSENT.   Last mammogram: 01/10/23 BIRADS 1 Neg, density A Last colonoscopy: 12/01/23, multiple polyps DXA: never  Exercising: Not currently, limited by back pain from spinal stenosis and bilateral knee arthritis Smoker: No  Flowsheet Row Office Visit from 01/15/2024 in [facility]  PHQ-2 Total Score 0      GYN HISTORY: No significant history  OB History  Gravida Para Term Preterm AB Living  3 2 2  1 2   SAB IAB Ectopic Multiple Live Births  1    2    # Outcome Date GA Lbr Len/2nd Weight Sex Type Anes PTL Lv  3 Term     F CS-Unspec   LIV  2 Term     F CS-Unspec   LIV  1 SAB            Past Medical History:  Diagnosis Date   Asthma    as a child   Hypertension    Low vitamin D  level    Spinal stenosis    Past Surgical History:  Procedure Laterality Date   CESAREAN SECTION     DILATION AND CURETTAGE OF UTERUS     OOPHORECTOMY     both   TUBAL LIGATION     Current Outpatient Medications on File Prior to Visit  Medication Sig Dispense Refill   amLODipine  (NORVASC ) 10 MG tablet Take 1 tablet (10 mg total) by mouth daily. 90 tablet 3   Evolocumab  (REPATHA  SURECLICK) 140  MG/ML SOAJ Inject 140 mg into the skin every 14 (fourteen) days. 6 mL 0   furosemide (LASIX) 40 MG tablet Take 40 mg by mouth daily.     hydrALAZINE (APRESOLINE) 25 MG tablet Take 0.5 tablets by mouth 3 (three) times daily.     losartan (COZAAR) 100 MG tablet Take 1 tablet by mouth daily.     MOUNJARO 2.5 MG/0.5ML Pen SMARTSIG:0.5 Milliliter(s) SUB-Q Once a Week     Nebivolol  HCl 20 MG TABS Take 1 tablet by mouth daily.      spironolactone (ALDACTONE) 100 MG tablet Take 50 mg by mouth 2 (two) times daily.      Vitamin D , Ergocalciferol , (DRISDOL ) 1.25 MG (50000 UT) CAPS capsule Take 1 capsule (50,000 Units total) by mouth every 7 (seven) days. 16 capsule 1   aspirin  EC 81 MG tablet Take 1 tablet (81 mg total) by mouth daily. Swallow whole. (Patient not taking: Reported on 01/15/2024) 30 tablet 12   No current facility-administered medications on file prior to visit.   Social History   Socioeconomic History   Marital status: Married    Spouse name: Not on file   Number of children: 2   Years of education: Not  on file   Highest education level: Not on file  Occupational History   Not on file  Tobacco Use   Smoking status: Never   Smokeless tobacco: Never  Vaping Use   Vaping status: Never Used  Substance and Sexual Activity   Alcohol use: No   Drug use: No   Sexual activity: Not Currently    Birth control/protection: Post-menopausal, Surgical    Comment: BTL  Other Topics Concern   Not on file  Social History Narrative   Not on file   Social Drivers of Health   Financial Resource Strain: Not on file  Food Insecurity: Not on file  Transportation Needs: Not on file  Physical Activity: Not on file  Stress: Not on file  Social Connections: Not on file  Intimate Partner Violence: Not on file   Family History  Problem Relation Age of Onset   Hypertension Mother    Diabetes Mother    Colon cancer Brother    Ovarian cancer Maternal Grandmother    Hypertension Maternal  Grandmother    Diabetes Maternal Grandmother    Breast cancer Neg Hx    Allergies  Allergen Reactions   Statins Other (See Comments)    Muscle aches   Lisinopril Other (See Comments)    Pt unsure of reaction: thinks it may make her cough and keep awake through the night     PE Today's Vitals   01/15/24 0808  BP: 124/80  Pulse: 87  Temp: 98.1 F (36.7 C)  SpO2: 97%  Weight: 279 lb (126.6 kg)  Height: 5' 7 (1.702 m)   Body mass index is 43.7 kg/m.  Physical Exam Vitals reviewed. Exam conducted with a chaperone present.  Constitutional:      General: She is not in acute distress.    Appearance: Normal appearance.  HENT:     Head: Normocephalic and atraumatic.     Nose: Nose normal.  Eyes:     Extraocular Movements: Extraocular movements intact.     Conjunctiva/sclera: Conjunctivae normal.  Neck:     Thyroid : No thyroid  mass, thyromegaly or thyroid  tenderness.  Pulmonary:     Effort: Pulmonary effort is normal.  Chest:     Chest wall: No mass or tenderness.  Breasts:    Right: Normal. No swelling, mass, nipple discharge, skin change or tenderness.     Left: Normal. No swelling, mass, nipple discharge, skin change or tenderness.  Abdominal:     General: There is no distension.     Palpations: Abdomen is soft.     Tenderness: There is no abdominal tenderness.  Genitourinary:    General: Normal vulva.     Exam position: Lithotomy position.     Urethra: No prolapse.     Vagina: Normal. No vaginal discharge or bleeding.     Cervix: Normal. No lesion.     Uterus: Normal. Not enlarged and not tender.      Adnexa: Right adnexa normal and left adnexa normal.  Musculoskeletal:        General: Normal range of motion.     Cervical back: Normal range of motion.  Lymphadenopathy:     Upper Body:     Right upper body: No axillary adenopathy.     Left upper body: No axillary adenopathy.     Lower Body: No right inguinal adenopathy. No left inguinal adenopathy.  Skin:     General: Skin is warm and dry.  Neurological:     General: No focal deficit present.  Mental Status: She is alert.  Psychiatric:        Mood and Affect: Mood normal.        Behavior: Behavior normal.      Assessment and Plan:        Well woman exam with routine gynecological exam Assessment & Plan: Cervical cancer screening performed according to ASCCP guidelines. Encouraged annual mammogram screening Colonoscopy UTD DXA ordered; given hx of vit D deficiency Labs and immunizations with her primary Encouraged safe sexual practices as indicated Encouraged healthy lifestyle practices with diet and exercise For patients under 50-70yo, I recommend 1200mg  calcium  daily and 600IU of vitamin D  daily.    Screening for depression  Vitamin D  deficiency -     DG Bone Density; Future  Screening for osteoporosis -     DG Bone Density; Future   Vera LULLA Pa, MD

## 2024-01-15 ENCOUNTER — Other Ambulatory Visit: Payer: Self-pay | Admitting: Family Medicine

## 2024-01-15 ENCOUNTER — Encounter: Payer: Self-pay | Admitting: Obstetrics and Gynecology

## 2024-01-15 ENCOUNTER — Ambulatory Visit: Payer: 59 | Admitting: Obstetrics and Gynecology

## 2024-01-15 VITALS — BP 124/80 | HR 87 | Temp 98.1°F | Ht 67.0 in | Wt 279.0 lb

## 2024-01-15 DIAGNOSIS — Z01419 Encounter for gynecological examination (general) (routine) without abnormal findings: Secondary | ICD-10-CM | POA: Diagnosis not present

## 2024-01-15 DIAGNOSIS — Z13820 Encounter for screening for osteoporosis: Secondary | ICD-10-CM

## 2024-01-15 DIAGNOSIS — Z1331 Encounter for screening for depression: Secondary | ICD-10-CM | POA: Diagnosis not present

## 2024-01-15 DIAGNOSIS — Z1231 Encounter for screening mammogram for malignant neoplasm of breast: Secondary | ICD-10-CM

## 2024-01-15 DIAGNOSIS — E559 Vitamin D deficiency, unspecified: Secondary | ICD-10-CM

## 2024-01-15 NOTE — Assessment & Plan Note (Signed)
 Cervical cancer screening performed according to ASCCP guidelines. Encouraged annual mammogram screening Colonoscopy UTD DXA ordered; given hx of vit D deficiency Labs and immunizations with her primary Encouraged safe sexual practices as indicated Encouraged healthy lifestyle practices with diet and exercise For patients under 50-63yo, I recommend 1200mg  calcium  daily and 600IU of vitamin D  daily.

## 2024-01-15 NOTE — Patient Instructions (Signed)
 For patients under 50-63yo, I recommend 1200mg  calcium  daily and 600IU of vitamin D daily. For patients over 63yo, I recommend 1200mg  calcium  daily and 800IU of vitamin D daily.  Health Maintenance, Female Adopting a healthy lifestyle and getting preventive care are important in promoting health and wellness. Ask your health care provider about: The right schedule for you to have regular tests and exams. Things you can do on your own to prevent diseases and keep yourself healthy. What should I know about diet, weight, and exercise? Eat a healthy diet  Eat a diet that includes plenty of vegetables, fruits, low-fat dairy products, and lean protein. Do not eat a lot of foods that are high in solid fats, added sugars, or sodium. Maintain a healthy weight Body mass index (BMI) is used to identify weight problems. It estimates body fat based on height and weight. Your health care provider can help determine your BMI and help you achieve or maintain a healthy weight. Get regular exercise Get regular exercise. This is one of the most important things you can do for your health. Most adults should: Exercise for at least 150 minutes each week. The exercise should increase your heart rate and make you sweat (moderate-intensity exercise). Do strengthening exercises at least twice a week. This is in addition to the moderate-intensity exercise. Spend less time sitting. Even light physical activity can be beneficial. Watch cholesterol and blood lipids Have your blood tested for lipids and cholesterol at 63 years of age, then have this test every 5 years. Have your cholesterol levels checked more often if: Your lipid or cholesterol levels are high. You are older than 63 years of age. You are at high risk for heart disease. What should I know about cancer screening? Depending on your health history and family history, you may need to have cancer screening at various ages. This may include screening  for: Breast cancer. Cervical cancer. Colorectal cancer. Skin cancer. Lung cancer. What should I know about heart disease, diabetes, and high blood pressure? Blood pressure and heart disease High blood pressure causes heart disease and increases the risk of stroke. This is more likely to develop in people who have high blood pressure readings or are overweight. Have your blood pressure checked: Every 3-5 years if you are 25-57 years of age. Every year if you are 24 years old or older. Diabetes Have regular diabetes screenings. This checks your fasting blood sugar level. Have the screening done: Once every three years after age 62 if you are at a normal weight and have a low risk for diabetes. More often and at a younger age if you are overweight or have a high risk for diabetes. What should I know about preventing infection? Hepatitis B If you have a higher risk for hepatitis B, you should be screened for this virus. Talk with your health care provider to find out if you are at risk for hepatitis B infection. Hepatitis C Testing is recommended for: Everyone born from [birth year] through 1965. Anyone with known risk factors for hepatitis C. Sexually transmitted infections (STIs) Get screened for STIs, including gonorrhea and chlamydia, if: You are sexually active and are younger than 63 years of age. You are older than 63 years of age and your health care provider tells you that you are at risk for this type of infection. Your sexual activity has changed since you were last screened, and you are at increased risk for chlamydia or gonorrhea. Ask your health care provider if  you are at risk. Ask your health care provider about whether you are at high risk for HIV. Your health care provider may recommend a prescription medicine to help prevent HIV infection. If you choose to take medicine to prevent HIV, you should first get tested for HIV. You should then be tested every 3 months for as long as you  are taking the medicine. Osteoporosis and menopause Osteoporosis is a disease in which the bones lose minerals and strength with aging. This can result in bone fractures. If you are 72 years old or older, or if you are at risk for osteoporosis and fractures, ask your health care provider if you should: Be screened for bone loss. Take a calcium  or vitamin D supplement to lower your risk of fractures. Be given hormone replacement therapy (HRT) to treat symptoms of menopause. Follow these instructions at home: Alcohol use Do not drink alcohol if: Your health care provider tells you not to drink. You are pregnant, may be pregnant, or are planning to become pregnant. If you drink alcohol: Limit how much you have to: 0-1 drink a day. Know how much alcohol is in your drink. In the [place] one drink equals one 12 oz bottle of beer (355 mL), one 5 oz glass of wine (148 mL), or one 1 oz glass of hard liquor (44 mL). Lifestyle Do not use any products that contain nicotine or tobacco. These products include cigarettes, chewing tobacco, and vaping devices, such as e-cigarettes. If you need help quitting, ask your health care provider. Do not use street drugs. Do not share needles. Ask your health care provider for help if you need support or information about quitting drugs. General instructions Schedule regular health, dental, and eye exams. Stay current with your vaccines. Tell your health care provider if: You often feel depressed. You have ever been abused or do not feel safe at home. Summary Adopting a healthy lifestyle and getting preventive care are important in promoting health and wellness. Follow your health care provider's instructions about healthy diet, exercising, and getting tested or screened for diseases. Follow your health care provider's instructions on monitoring your cholesterol and blood pressure. This information is not intended to replace advice given to you by your health  care provider. Make sure you discuss any questions you have with your health care provider. Document Revised: 07/20/2020 Document Reviewed: 07/20/2020 Elsevier Patient Education  2024 [employer].

## 2024-01-29 ENCOUNTER — Ambulatory Visit (HOSPITAL_COMMUNITY)
Admission: RE | Admit: 2024-01-29 | Discharge: 2024-01-29 | Disposition: A | Discharge status: Home / Self Care | Source: Ambulatory Visit | Attending: Surgery | Admitting: Surgery

## 2024-01-29 ENCOUNTER — Other Ambulatory Visit (HOSPITAL_COMMUNITY): Payer: Self-pay | Admitting: Nephrology

## 2024-01-29 DIAGNOSIS — I1A0 Resistant hypertension: Secondary | ICD-10-CM | POA: Diagnosis present

## 2024-02-12 ENCOUNTER — Ambulatory Visit
Admission: RE | Admit: 2024-02-12 | Discharge: 2024-02-12 | Disposition: A | Source: Ambulatory Visit | Attending: Family Medicine | Admitting: Family Medicine

## 2024-02-12 DIAGNOSIS — Z1231 Encounter for screening mammogram for malignant neoplasm of breast: Secondary | ICD-10-CM

## 2024-03-11 ENCOUNTER — Ambulatory Visit (HOSPITAL_BASED_OUTPATIENT_CLINIC_OR_DEPARTMENT_OTHER)
Admission: RE | Admit: 2024-03-11 | Discharge: 2024-03-11 | Disposition: A | Discharge status: Home / Self Care | Source: Ambulatory Visit | Attending: Obstetrics and Gynecology | Admitting: Obstetrics and Gynecology

## 2024-03-11 DIAGNOSIS — Z13820 Encounter for screening for osteoporosis: Secondary | ICD-10-CM | POA: Diagnosis present

## 2024-03-11 DIAGNOSIS — E559 Vitamin D deficiency, unspecified: Secondary | ICD-10-CM | POA: Diagnosis present

## 2024-03-12 ENCOUNTER — Ambulatory Visit: Payer: Self-pay | Admitting: Obstetrics and Gynecology
